# Patient Record
Sex: Female | Born: 1985 | Race: Black or African American | Hispanic: No | Marital: Married | Smoking: Never smoker
Health system: Southern US, Community
[De-identification: ages and names within clinical notes are randomized; demographics above are authoritative.]

## PROBLEM LIST (undated history)

## (undated) ENCOUNTER — Inpatient Hospital Stay (HOSPITAL_COMMUNITY): Payer: Self-pay

## (undated) DIAGNOSIS — R519 Headache, unspecified: Secondary | ICD-10-CM

## (undated) DIAGNOSIS — R51 Headache: Secondary | ICD-10-CM

## (undated) DIAGNOSIS — D219 Benign neoplasm of connective and other soft tissue, unspecified: Secondary | ICD-10-CM

## (undated) DIAGNOSIS — D649 Anemia, unspecified: Secondary | ICD-10-CM

## (undated) DIAGNOSIS — K219 Gastro-esophageal reflux disease without esophagitis: Secondary | ICD-10-CM

## (undated) DIAGNOSIS — O139 Gestational [pregnancy-induced] hypertension without significant proteinuria, unspecified trimester: Secondary | ICD-10-CM

## (undated) HISTORY — DX: Anemia, unspecified: D64.9

## (undated) HISTORY — PX: NO PAST SURGERIES: SHX2092

---

## 2013-01-13 ENCOUNTER — Other Ambulatory Visit (HOSPITAL_COMMUNITY)
Admission: RE | Admit: 2013-01-13 | Discharge: 2013-01-13 | Disposition: A | Discharge status: Home / Self Care | Payer: Self-pay | Source: Ambulatory Visit | Attending: Family Medicine | Admitting: Family Medicine

## 2013-01-13 DIAGNOSIS — Z0000 Encounter for general adult medical examination without abnormal findings: Secondary | ICD-10-CM | POA: Insufficient documentation

## 2013-03-18 ENCOUNTER — Encounter (HOSPITAL_COMMUNITY): Payer: Self-pay

## 2013-03-18 ENCOUNTER — Inpatient Hospital Stay (HOSPITAL_COMMUNITY): Payer: BC Managed Care – PPO

## 2013-03-18 ENCOUNTER — Inpatient Hospital Stay (HOSPITAL_COMMUNITY)
Admission: AD | Admit: 2013-03-18 | Discharge: 2013-03-18 | Disposition: A | Discharge status: Home / Self Care | Payer: BC Managed Care – PPO | Source: Ambulatory Visit | Attending: Obstetrics and Gynecology | Admitting: Obstetrics and Gynecology

## 2013-03-18 ENCOUNTER — Other Ambulatory Visit: Payer: Self-pay | Admitting: Obstetrics and Gynecology

## 2013-03-18 DIAGNOSIS — O021 Missed abortion: Secondary | ICD-10-CM

## 2013-03-18 DIAGNOSIS — R109 Unspecified abdominal pain: Secondary | ICD-10-CM | POA: Insufficient documentation

## 2013-03-18 DIAGNOSIS — O209 Hemorrhage in early pregnancy, unspecified: Secondary | ICD-10-CM | POA: Insufficient documentation

## 2013-03-18 LAB — TYPE AND SCREEN: Antibody Screen: NEGATIVE

## 2013-03-18 LAB — CBC
HCT: 34 % — ABNORMAL LOW (ref 36.0–46.0)
Hemoglobin: 11.7 g/dL — ABNORMAL LOW (ref 12.0–15.0)
MCHC: 34.4 g/dL (ref 30.0–36.0)
WBC: 6.6 10*3/uL (ref 4.0–10.5)

## 2013-03-18 LAB — HCG, QUANTITATIVE, PREGNANCY: hCG, Beta Chain, Quant, S: 303 m[IU]/mL — ABNORMAL HIGH (ref <5)

## 2013-03-18 LAB — ABO/RH: ABO/RH(D): A NEG

## 2013-03-18 NOTE — MAU Note (Signed)
Patient was sent from the office to r/o ectopic pregnancy. Patient states she started spotting yesterday getting heavier this am with some abdominal pain. Patient states she is having minimal bleeding and no pain at this time. Dr. Dion Body states bedside ultrasound did not see pregnancy.

## 2013-03-20 ENCOUNTER — Inpatient Hospital Stay (HOSPITAL_COMMUNITY)
Admission: AD | Admit: 2013-03-20 | Discharge: 2013-03-20 | Disposition: A | Discharge status: Home / Self Care | Payer: BC Managed Care – PPO | Source: Ambulatory Visit | Attending: Obstetrics and Gynecology | Admitting: Obstetrics and Gynecology

## 2013-03-20 DIAGNOSIS — O039 Complete or unspecified spontaneous abortion without complication: Secondary | ICD-10-CM | POA: Insufficient documentation

## 2013-03-20 LAB — HCG, QUANTITATIVE, PREGNANCY: hCG, Beta Chain, Quant, S: 111 m[IU]/mL — ABNORMAL HIGH (ref <5)

## 2013-03-20 NOTE — MAU Note (Signed)
Pt here for f/u  BHCG from Friday.Pt reports she was having pain yesterday and bleeding was heavier. Bleeding is much less today and not having very much pain a t all.

## 2013-03-20 NOTE — MAU Provider Note (Signed)
Ms. Loise Esguerra is a 27 y.o. G1P0 at [redacted]w[redacted]d who presents to MAU today for follow-up quant bhCG. The patient states that bleeding and pain have improved since last visit. Denies N/V or fever.   BP 138/82  Pulse 92  Resp 18  LMP 02/03/2013 GENERAL: Well-developed, well-nourished female in no acute distress.  HEENT: Normocephalic, atraumatic.  LUNGS: Normal effort HEART: Regular rate  SKIN: warm, dry and without erythema PSYCH: normal mood and affect  Results for orders placed during the hospital encounter of 03/20/13 (from the past 24 hour(s))  HCG, QUANTITATIVE, PREGNANCY     Status: Abnormal   Collection Time    03/20/13 12:22 PM      Result Value Range   hCG, Beta Chain, Quant, S 111 (*) <5 mIU/mL    A: SAB  P: Discharge home Bleeding precautions discussed Patient encouraged to keep scheduled follow-up with Dr. Dion Body in the office Patient may return to MAU as needed or if her condition were to change or worsen  Freddi Starr, PA-C 03/20/2013 1:38 PM

## 2013-03-21 NOTE — MAU Provider Note (Signed)
Recommendations d/w PA prior to pt counseling.  Recommended f/u in 2 weeks instead of 1 week if bleeding decreased in light of decreasing quant BHCG.

## 2013-03-21 NOTE — Progress Notes (Signed)
Late entry- Pt's history and physical done in office.  Sent to MAU b/c per my limited transabdominal exam, no signs of an IUP was seen, which was expected for a 6+ week pregnancy.  Quant BHCG reviewed c/w abnormal pregnancy.  Blood type and CBC pending. Ultrasound reviewed with radiologist.  No IUP confirmed, no adnexal masses.  Radiologist reports there was active flow through endocervix, which is highly suggestive of SAB in progress. Ultrasound findings reviewed with pt and husband.  Recommend repeat quant BHCG in 48 hours to confirm SAB.   Pt with an appropriate response, very tearful.  Etiology of SAB and future pregnancies discussed.  All questions of pt and husband answered.  Recommend f/u in 1 week, in the event BHCG is rising.    Addendum- CBC and Type/Screen reviewed.  Pt is Rh neg.  Will call pt to get Rhogam in office on 03/21/2013.

## 2013-06-29 LAB — OB RESULTS CONSOLE RUBELLA ANTIBODY, IGM: Rubella: IMMUNE

## 2013-06-29 LAB — OB RESULTS CONSOLE HEPATITIS B SURFACE ANTIGEN: Hepatitis B Surface Ag: NEGATIVE

## 2013-06-29 LAB — OB RESULTS CONSOLE RPR: RPR: NONREACTIVE

## 2013-06-30 LAB — OB RESULTS CONSOLE HIV ANTIBODY (ROUTINE TESTING): HIV: NONREACTIVE

## 2013-07-06 ENCOUNTER — Encounter (HOSPITAL_COMMUNITY): Payer: Self-pay | Admitting: *Deleted

## 2013-07-06 ENCOUNTER — Inpatient Hospital Stay (HOSPITAL_COMMUNITY)
Admission: AD | Admit: 2013-07-06 | Discharge: 2013-07-06 | Disposition: A | Discharge status: Home / Self Care | Payer: BC Managed Care – PPO | Source: Ambulatory Visit | Attending: Obstetrics and Gynecology | Admitting: Obstetrics and Gynecology

## 2013-07-06 DIAGNOSIS — R51 Headache: Secondary | ICD-10-CM | POA: Insufficient documentation

## 2013-07-06 DIAGNOSIS — O210 Mild hyperemesis gravidarum: Secondary | ICD-10-CM | POA: Insufficient documentation

## 2013-07-06 DIAGNOSIS — O26891 Other specified pregnancy related conditions, first trimester: Secondary | ICD-10-CM

## 2013-07-06 LAB — URINALYSIS, ROUTINE W REFLEX MICROSCOPIC
Bilirubin Urine: NEGATIVE
Ketones, ur: NEGATIVE mg/dL
Leukocytes, UA: NEGATIVE
Nitrite: NEGATIVE
Specific Gravity, Urine: 1.025 (ref 1.005–1.030)
Urobilinogen, UA: 0.2 mg/dL (ref 0.0–1.0)
pH: 6 (ref 5.0–8.0)

## 2013-07-06 MED ORDER — DOXYLAMINE-PYRIDOXINE 10-10 MG PO TBEC: 2.0000 | DELAYED_RELEASE_TABLET | Freq: Every day | ORAL | Status: DC

## 2013-07-06 MED ORDER — ACETAMINOPHEN 500 MG PO TABS
1000.0000 mg | ORAL_TABLET | Freq: Once | ORAL | Status: AC
  Administered 2013-07-06: 1000 mg via ORAL
  Filled 2013-07-06: qty 2

## 2013-07-06 MED ORDER — PROMETHAZINE HCL 25 MG PO TABS
25.0000 mg | ORAL_TABLET | Freq: Once | ORAL | Status: AC
  Administered 2013-07-06: 25 mg via ORAL
  Filled 2013-07-06: qty 1

## 2013-07-06 MED ORDER — OXYCODONE HCL 5 MG PO TABS
5.0000 mg | ORAL_TABLET | Freq: Once | ORAL | Status: AC
  Administered 2013-07-06: 5 mg via ORAL
  Filled 2013-07-06: qty 1

## 2013-07-06 MED ORDER — PROMETHAZINE HCL 25 MG PO TABS: 25.0000 mg | ORAL_TABLET | Freq: Four times a day (QID) | ORAL | Status: DC | PRN

## 2013-07-06 NOTE — MAU Note (Signed)
Pt reports vomiting with pregnancy that has been worsening over the last week. Has meds form MD office but she feels like the meds give her a headache.

## 2013-07-06 NOTE — MAU Provider Note (Signed)
Chief Complaint:  Hyperemesis Gravidarum   First Provider Initiated Contact with Patient 07/06/13 [phone]     HPI: Alice Osborne is a 27 y.o. G2P0 at [redacted]w[redacted]d by LMP who presents to maternity admissions reporting worsening N/V of pregnancy over the past week. Was Rx'd Zofran, but is having generalized HA's that coincide w/ doses of Zofran and no apparent improvement in Sx. Denies fever, chills, neck pain, difficulties w/ speech or gait, abd pain, vaginal bleeding. Very nervous because of SAB 03/2013. Has tried dietary changes for N/V w/ out improvement. Has not tried any other meds.   Past Medical History: History reviewed. No pertinent past medical history.  Past obstetric history: OB History   Grav Para Term Preterm Abortions TAB SAB Ect Mult Living   2         0     # Outc Date GA Lbr Len/2nd Wgt Sex Del Anes PTL Lv   1 GRA            Comments: System Generated. Please review and update pregnancy details.   2 CUR               Past Surgical History: Past Surgical History  Procedure Laterality Date  . No past surgeries      Family History: History reviewed. No pertinent family history.  Social History: History  Substance Use Topics  . Smoking status: Never Smoker   . Smokeless tobacco: Never Used  . Alcohol Use: No    Allergies: No Known Allergies  Meds:  Prescriptions prior to admission  Medication Sig Dispense Refill  . ondansetron (ZOFRAN-ODT) 4 MG disintegrating tablet Take 4 mg by mouth every 8 (eight) hours as needed for nausea.      . Prenatal Vit-Fe Fumarate-FA (PRENATAL MULTIVITAMIN) TABS Take 1 tablet by mouth daily at 12 noon.        ROS: Pertinent findings in history of present illness.  Physical Exam  Blood pressure 129/85, pulse 77, temperature 98 F (36.7 C), temperature source Oral, resp. rate 18, height 5\' 3"  (1.6 m), weight 72.122 kg (159 lb), last menstrual period 04/21/2013, SpO2 100.00%, unknown if currently breastfeeding. GENERAL: Well-developed,  well-nourished female in no acute distress.  HEENT: normocephalic. Mucus membranes moist. HEART: normal rate RESP: normal effort EXTREMITIES: Nontender, no edema NEURO: alert and oriented FHT: 165 by doppler   Labs: Results for orders placed during the hospital encounter of 07/06/13 (from the past 24 hour(s))  URINALYSIS, ROUTINE W REFLEX MICROSCOPIC     Status: None   Collection Time    07/06/13  6:19 AM      Result Value Range   Color, Urine YELLOW  YELLOW   APPearance CLEAR  CLEAR   Specific Gravity, Urine 1.025  1.005 - 1.030   pH 6.0  5.0 - 8.0   Glucose, UA NEGATIVE  NEGATIVE mg/dL   Hgb urine dipstick NEGATIVE  NEGATIVE   Bilirubin Urine NEGATIVE  NEGATIVE   Ketones, ur NEGATIVE  NEGATIVE mg/dL   Protein, ur NEGATIVE  NEGATIVE mg/dL   Urobilinogen, UA 0.2  0.0 - 1.0 mg/dL   Nitrite NEGATIVE  NEGATIVE   Leukocytes, UA NEGATIVE  NEGATIVE    Imaging:  No results found.  MAU Course: Pt thinks she can keep down a tablet. Will give phenergan. If tolerating PO's, will PO hydrate and give HA meds PRN. Pt got relief of nausea with phenergan- pt able to tolerated PO fluids Tylenol 1000mg  given for headache which brought pain down  from 10/10 to 8/10- pain is right side of head Care of pt turned over to Pamelia Hoit, NP at 0800. Since pt's headache not relieved- Oxycodone 5mg  ordered- pt's headache pain reduced to 2/10 Pt ready to go home Discussed with Dr. Dion Body- will send pt home with Diclegis and phenergan and make sure pt is able to PO hydrate  Assessment/Plan: Hyperemesis in early pregnancy- phenergan 25mg  tablets; Diclegis  Diet for hyperemesis Headache in pregnancy- push PO fluids Keep appointment Fri with Dr. Dion Body as scheduled Dorathy Kinsman, CNM 07/06/2013 8:16 AM Pamelia Hoit WHNP-BC

## 2013-07-06 NOTE — MAU Note (Signed)
Pt comfortable, meds given

## 2013-07-08 LAB — OB RESULTS CONSOLE GC/CHLAMYDIA
Chlamydia: NEGATIVE
Gonorrhea: NEGATIVE

## 2013-11-03 ENCOUNTER — Ambulatory Visit
Admission: RE | Admit: 2013-11-03 | Discharge: 2013-11-03 | Disposition: A | Discharge status: Home / Self Care | Payer: BC Managed Care – PPO | Source: Ambulatory Visit | Attending: Obstetrics and Gynecology | Admitting: Obstetrics and Gynecology

## 2013-11-03 ENCOUNTER — Other Ambulatory Visit: Payer: Self-pay | Admitting: Obstetrics and Gynecology

## 2013-11-03 DIAGNOSIS — M79604 Pain in right leg: Secondary | ICD-10-CM

## 2013-11-03 IMAGING — US US EXTREM LOW VENOUS*R*
1 series · 14 of 24 positions shown · non-contrast
Comparison: None.

CLINICAL DATA: Right lower extremity pain and edema

EXAM:
RIGHT LOWER EXTREMITY VENOUS DUPLEX ULTRASOUND
TECHNIQUE: Gray-scale sonography with graded compression, as well as color
Doppler and duplex ultrasound, were performed to evaluate the deep
venous system from the level of the common femoral vein through the
popliteal and proximal calf veins. Spectral Doppler was utilized to
evaluate flow at rest and with distal augmentation maneuvers.

[Series 1: us extrem low venous*right* · 14 of 31 slices shown]
[im 1/31]
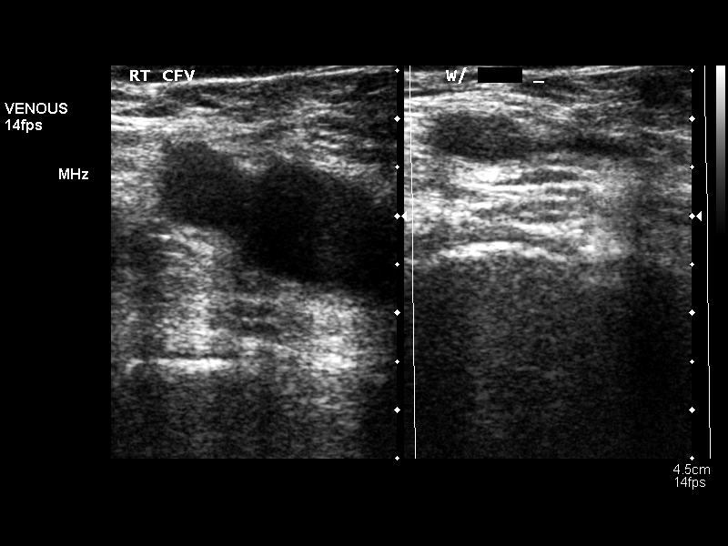
[im 3/31]
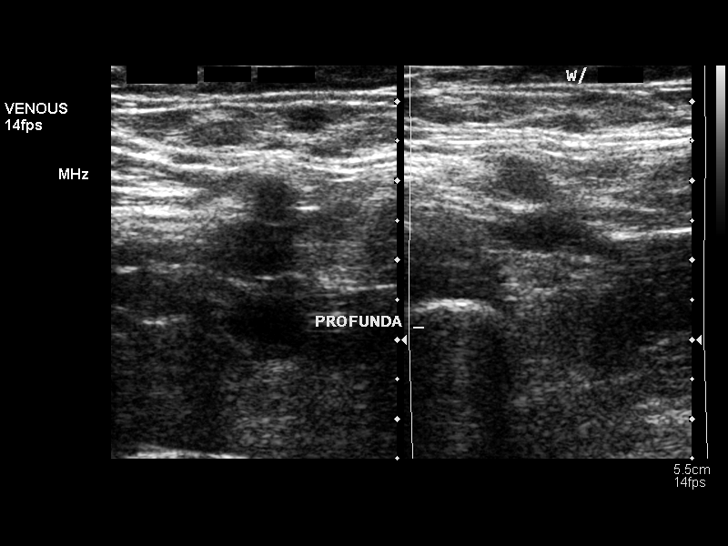
[im 6/31]
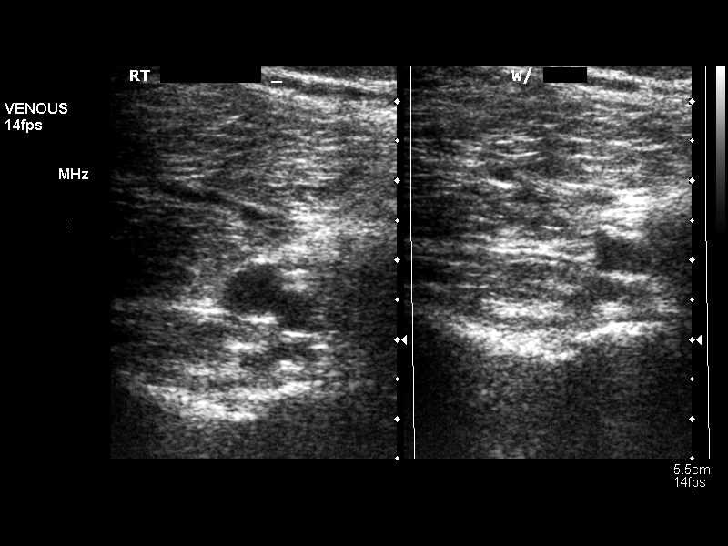
[im 8/31]
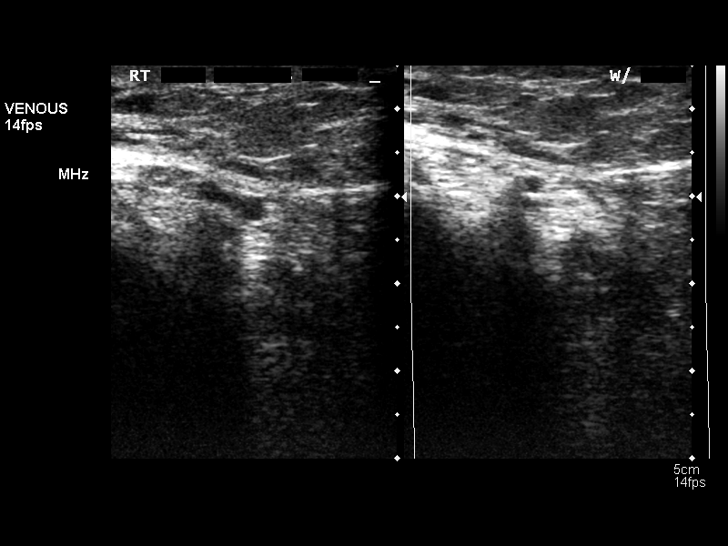
[im 10/31]
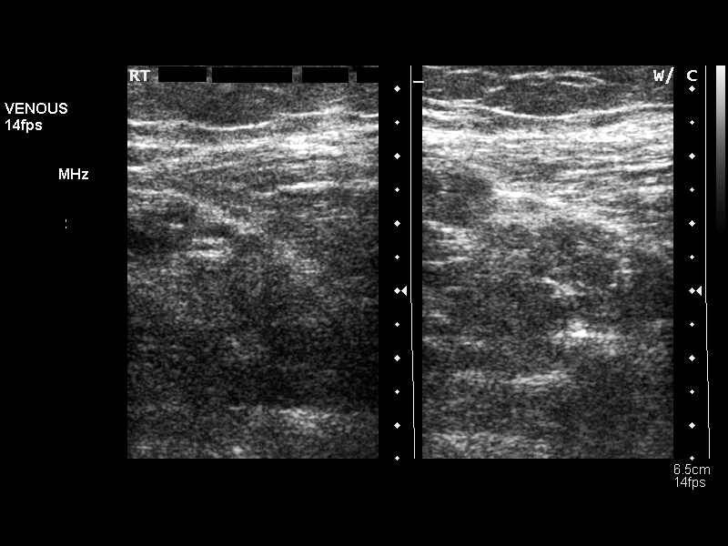
[im 12/31]
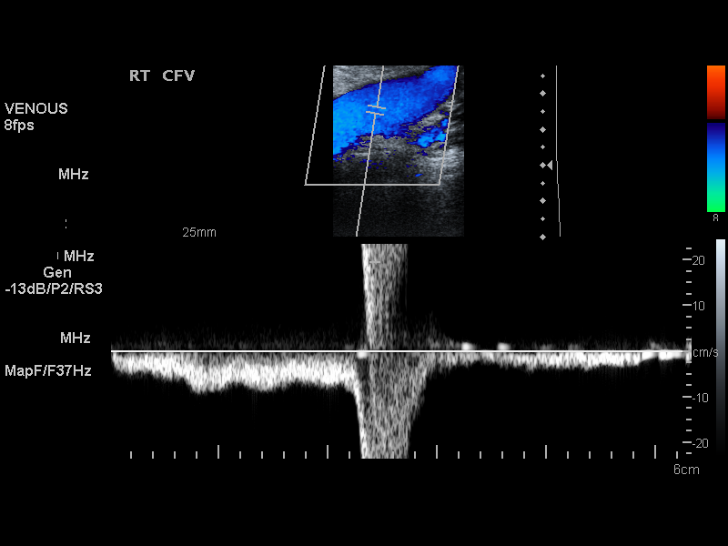
[im 15/31]
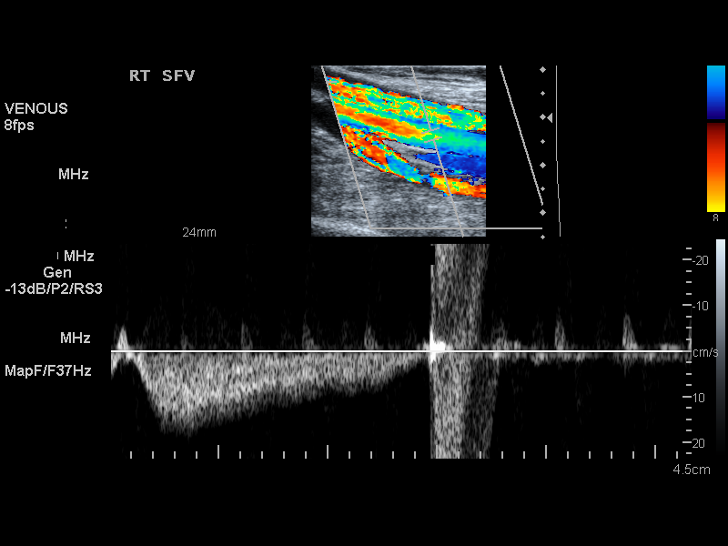
[im 16/31]
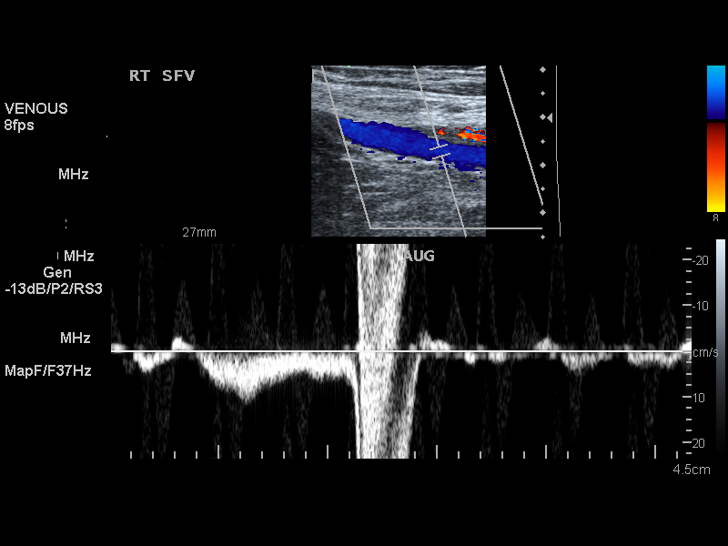
[im 19/31]
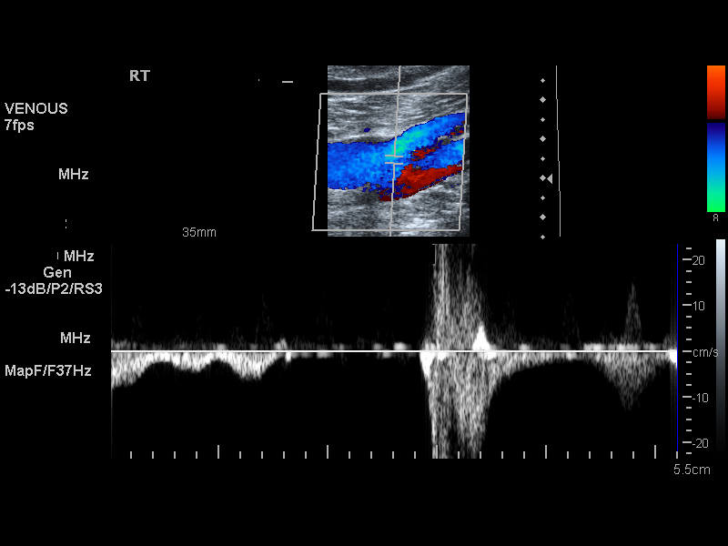
[im 21/31]
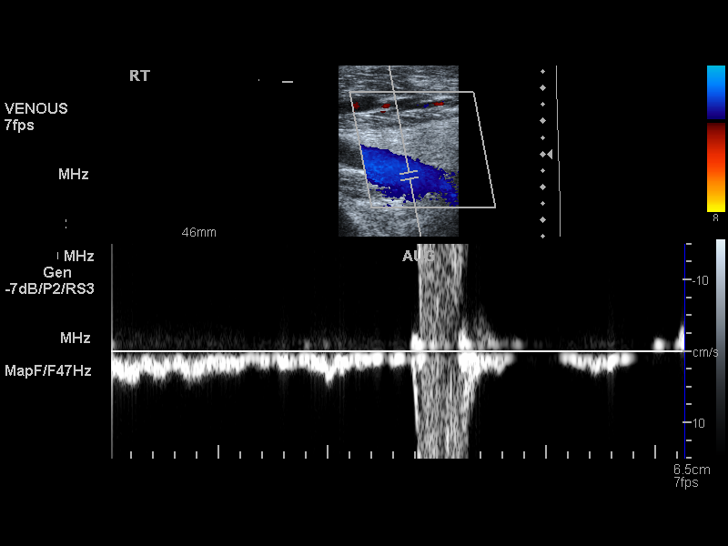
[im 24/31]
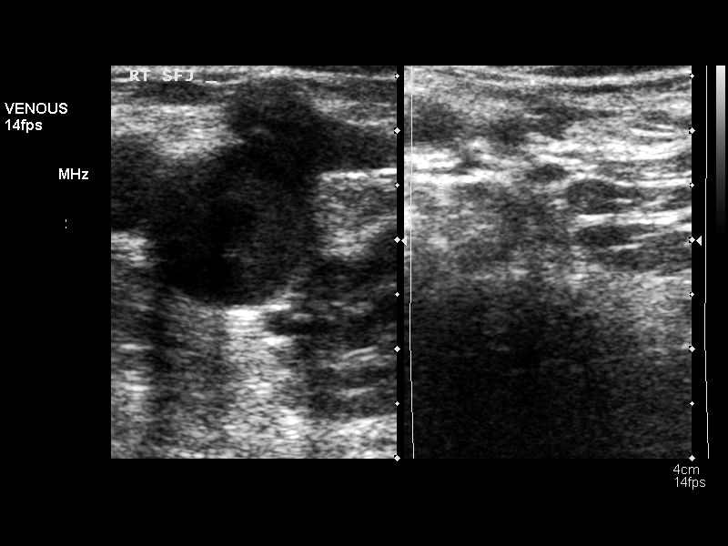
[im 25/31]
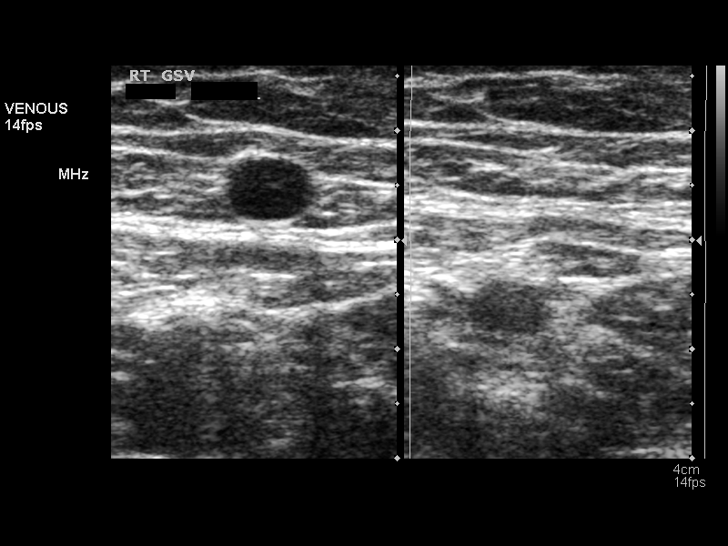
[im 28/31]
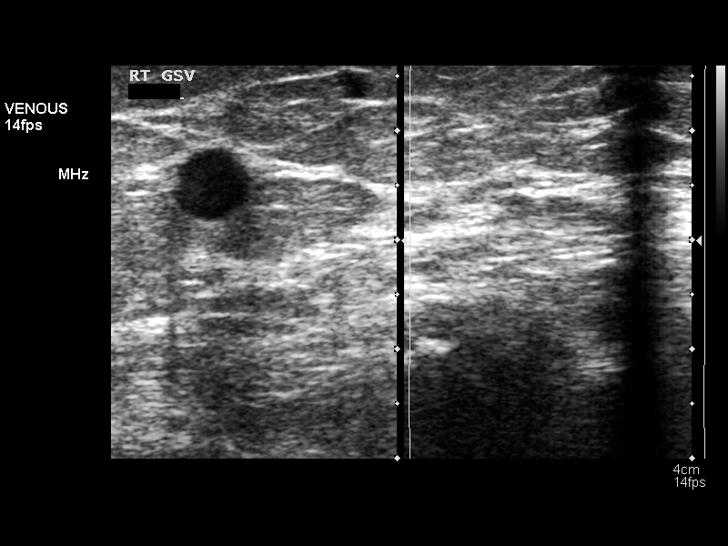
[im 31/31]
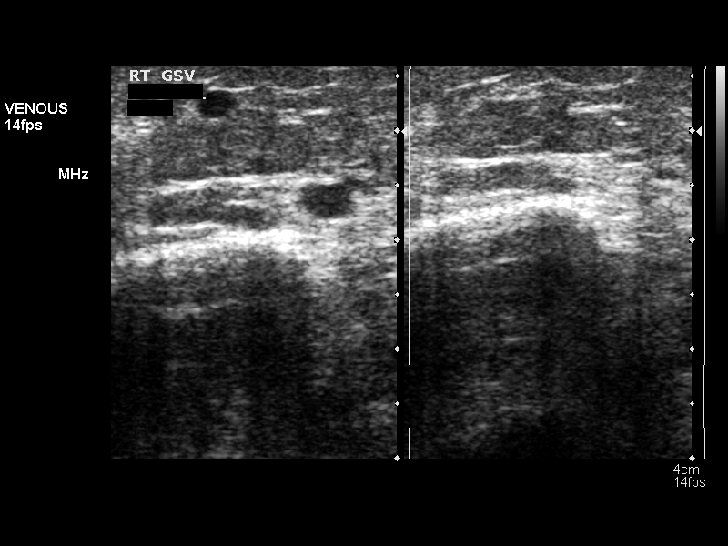

[14 of 24 positions shown; findings below may reference images not displayed]

FINDINGS: Flow in the venous structures of the right lower extremity is
spontaneous and phasic in all segments. There is normal compression
and augmentation in the venous structures of the right lower
extremity. Venous Doppler signal is normal in all regions. There is
no thrombus in the deep or visualized superficial venous structures
on the right. There is no right lower extremity deep venous
incompetence.
IMPRESSION: No evidence of right lower extremity deep venous thrombosis.

## 2013-12-15 NOTE — L&D Delivery Note (Signed)
Delivery Note At 12:18 AM a viable female was delivered via Vaginal, Spontaneous Delivery (Presentation: LOA).  Loose nuchal cord easily reduced over head prior to delivery of body.  APGAR: 7, 8; weight pending.   Placenta status: Intact, Spontaneous.  Cord: 3 vessels with the following complications: None.  Cord pH: 7.166 and 7.276.   Dr. Kennon Rounds called to bedside d/t low FHT.  NICU called to delivery for 35 weeks and mag sulfate.  Anesthesia: Epidural  Episiotomy: None Lacerations: 1st degree vaginal extending to left labial Suture Repair: 3.0 vicryl Est. Blood Loss (mL): 450 ml  Mom to AICU.  Baby to Couplet care / Skin to Skin.  Routine PP orders Mag sulfate x 24 hours PP Continue Labetalol  Breastfeeding Placenta to pathology  Myron Stankovich 12/27/2013, 1:13 AM

## 2013-12-15 NOTE — L&D Delivery Note (Signed)
I was not the doctor on at time of delivery.

## 2013-12-23 ENCOUNTER — Inpatient Hospital Stay (HOSPITAL_COMMUNITY): Payer: BC Managed Care – PPO

## 2013-12-23 ENCOUNTER — Inpatient Hospital Stay (HOSPITAL_COMMUNITY)
Admission: AD | Admit: 2013-12-23 | Discharge: 2013-12-29 | DRG: 774 | Disposition: A | Discharge status: Home / Self Care | Payer: BC Managed Care – PPO | Source: Ambulatory Visit | Attending: Obstetrics and Gynecology | Admitting: Obstetrics and Gynecology

## 2013-12-23 ENCOUNTER — Other Ambulatory Visit: Payer: Self-pay | Admitting: Obstetrics and Gynecology

## 2013-12-23 ENCOUNTER — Encounter (HOSPITAL_COMMUNITY): Payer: Self-pay | Admitting: *Deleted

## 2013-12-23 DIAGNOSIS — O2200 Varicose veins of lower extremity in pregnancy, unspecified trimester: Secondary | ICD-10-CM | POA: Diagnosis present

## 2013-12-23 DIAGNOSIS — O139 Gestational [pregnancy-induced] hypertension without significant proteinuria, unspecified trimester: Secondary | ICD-10-CM | POA: Diagnosis present

## 2013-12-23 DIAGNOSIS — O9989 Other specified diseases and conditions complicating pregnancy, childbirth and the puerperium: Secondary | ICD-10-CM

## 2013-12-23 DIAGNOSIS — M79609 Pain in unspecified limb: Secondary | ICD-10-CM

## 2013-12-23 DIAGNOSIS — O99892 Other specified diseases and conditions complicating childbirth: Secondary | ICD-10-CM | POA: Diagnosis present

## 2013-12-23 DIAGNOSIS — O1414 Severe pre-eclampsia complicating childbirth: Principal | ICD-10-CM | POA: Diagnosis present

## 2013-12-23 DIAGNOSIS — O1410 Severe pre-eclampsia, unspecified trimester: Secondary | ICD-10-CM | POA: Diagnosis present

## 2013-12-23 DIAGNOSIS — O360990 Maternal care for other rhesus isoimmunization, unspecified trimester, not applicable or unspecified: Secondary | ICD-10-CM | POA: Diagnosis present

## 2013-12-23 DIAGNOSIS — Z22330 Carrier of Group B streptococcus: Secondary | ICD-10-CM

## 2013-12-23 DIAGNOSIS — M7989 Other specified soft tissue disorders: Secondary | ICD-10-CM

## 2013-12-23 HISTORY — DX: Benign neoplasm of connective and other soft tissue, unspecified: D21.9

## 2013-12-23 HISTORY — DX: Gastro-esophageal reflux disease without esophagitis: K21.9

## 2013-12-23 HISTORY — DX: Gestational (pregnancy-induced) hypertension without significant proteinuria, unspecified trimester: O13.9

## 2013-12-23 LAB — CBC
HCT: 32.8 % — ABNORMAL LOW (ref 36.0–46.0)
Hemoglobin: 11.9 g/dL — ABNORMAL LOW (ref 12.0–15.0)
MCH: 31.7 pg (ref 26.0–34.0)
MCHC: 36.3 g/dL — ABNORMAL HIGH (ref 30.0–36.0)
MCV: 87.5 fL (ref 78.0–100.0)
Platelets: 155 10*3/uL (ref 150–400)
RBC: 3.75 MIL/uL — ABNORMAL LOW (ref 3.87–5.11)
RDW: 13.2 % (ref 11.5–15.5)
WBC: 7 10*3/uL (ref 4.0–10.5)

## 2013-12-23 LAB — COMPREHENSIVE METABOLIC PANEL
ALT: 24 U/L (ref 0–35)
AST: 35 U/L (ref 0–37)
Albumin: 2.6 g/dL — ABNORMAL LOW (ref 3.5–5.2)
Alkaline Phosphatase: 120 U/L — ABNORMAL HIGH (ref 39–117)
BUN: 23 mg/dL (ref 6–23)
CALCIUM: 9.2 mg/dL (ref 8.4–10.5)
CO2: 18 meq/L — AB (ref 19–32)
CREATININE: 0.69 mg/dL (ref 0.50–1.10)
Chloride: 100 mEq/L (ref 96–112)
Glucose, Bld: 75 mg/dL (ref 70–99)
Potassium: 4.6 mEq/L (ref 3.7–5.3)
Sodium: 133 mEq/L — ABNORMAL LOW (ref 137–147)
Total Bilirubin: <0.2 mg/dL — ABNORMAL LOW (ref 0.3–1.2)
Total Protein: 6.6 g/dL (ref 6.0–8.3)

## 2013-12-23 LAB — URINALYSIS, DIPSTICK ONLY
Bilirubin Urine: NEGATIVE
Glucose, UA: NEGATIVE mg/dL
KETONES UR: NEGATIVE mg/dL
Leukocytes, UA: NEGATIVE
NITRITE: NEGATIVE
PH: 6.5 (ref 5.0–8.0)
Protein, ur: >300 mg/dL — AB
Specific Gravity, Urine: 1.02 (ref 1.005–1.030)
Urobilinogen, UA: 0.2 mg/dL (ref 0.0–1.0)

## 2013-12-23 LAB — PROTEIN / CREATININE RATIO, URINE
CREATININE, URINE: 56.42 mg/dL
PROTEIN CREATININE RATIO: 9.5 — AB (ref 0.00–0.15)
TOTAL PROTEIN, URINE: 536.1 mg/dL

## 2013-12-23 LAB — GROUP B STREP BY PCR
GROUP B STREP BY PCR: INVALID — AB
Group B strep by PCR: POSITIVE — AB

## 2013-12-23 LAB — LACTATE DEHYDROGENASE: LDH: 255 U/L — ABNORMAL HIGH (ref 94–250)

## 2013-12-23 LAB — OB RESULTS CONSOLE GBS: GBS: POSITIVE

## 2013-12-23 LAB — URIC ACID: URIC ACID, SERUM: 6.6 mg/dL (ref 2.4–7.0)

## 2013-12-23 LAB — RPR: RPR Ser Ql: NONREACTIVE

## 2013-12-23 MED ORDER — ZOLPIDEM TARTRATE 5 MG PO TABS
5.0000 mg | ORAL_TABLET | Freq: Every evening | ORAL | Status: DC | PRN
  Administered 2013-12-23 – 2013-12-25 (×3): 5 mg via ORAL
  Filled 2013-12-23 (×3): qty 1

## 2013-12-23 MED ORDER — ACETAMINOPHEN 325 MG PO TABS: 650.0000 mg | ORAL_TABLET | ORAL | Status: DC | PRN

## 2013-12-23 MED ORDER — SODIUM CHLORIDE 0.9 % IJ SOLN
3.0000 mL | INTRAMUSCULAR | Status: DC | PRN
  Administered 2013-12-24 (×2): 3 mL via INTRAVENOUS

## 2013-12-23 MED ORDER — CITRIC ACID-SODIUM CITRATE 334-500 MG/5ML PO SOLN: 30.0000 mL | ORAL | Status: DC | PRN

## 2013-12-23 MED ORDER — ONDANSETRON HCL 4 MG/2ML IJ SOLN: 4.0000 mg | Freq: Four times a day (QID) | INTRAMUSCULAR | Status: DC | PRN

## 2013-12-23 MED ORDER — OXYTOCIN 40 UNITS IN LACTATED RINGERS INFUSION - SIMPLE MED: 62.5000 mL/h | INTRAVENOUS | Status: DC

## 2013-12-23 MED ORDER — FERROUS SULFATE 325 (65 FE) MG PO TABS
325.0000 mg | ORAL_TABLET | Freq: Two times a day (BID) | ORAL | Status: DC
  Administered 2013-12-24 (×2): 325 mg via ORAL
  Filled 2013-12-23 (×3): qty 1

## 2013-12-23 MED ORDER — IBUPROFEN 600 MG PO TABS: 600.0000 mg | ORAL_TABLET | Freq: Four times a day (QID) | ORAL | Status: DC | PRN

## 2013-12-23 MED ORDER — PRENATAL MULTIVITAMIN CH
1.0000 | ORAL_TABLET | Freq: Every day | ORAL | Status: DC
  Administered 2013-12-24: 1 via ORAL
  Filled 2013-12-23: qty 1

## 2013-12-23 MED ORDER — OXYTOCIN BOLUS FROM INFUSION: 500.0000 mL | INTRAVENOUS | Status: DC

## 2013-12-23 MED ORDER — DOCUSATE SODIUM 100 MG PO CAPS
100.0000 mg | ORAL_CAPSULE | Freq: Every day | ORAL | Status: DC
  Administered 2013-12-24: 100 mg via ORAL
  Filled 2013-12-23: qty 1

## 2013-12-23 MED ORDER — PROMETHAZINE HCL 25 MG PO TABS: 25.0000 mg | ORAL_TABLET | Freq: Four times a day (QID) | ORAL | Status: DC | PRN

## 2013-12-23 MED ORDER — LIDOCAINE HCL (PF) 1 % IJ SOLN: 30.0000 mL | INTRAMUSCULAR | Status: DC | PRN

## 2013-12-23 MED ORDER — ACETAMINOPHEN 325 MG PO TABS
650.0000 mg | ORAL_TABLET | Freq: Four times a day (QID) | ORAL | Status: DC | PRN
  Administered 2013-12-23 – 2013-12-24 (×3): 650 mg via ORAL
  Filled 2013-12-23 (×3): qty 2

## 2013-12-23 MED ORDER — SODIUM CHLORIDE 0.9 % IJ SOLN
3.0000 mL | Freq: Two times a day (BID) | INTRAMUSCULAR | Status: DC
  Administered 2013-12-23 – 2013-12-24 (×2): 3 mL via INTRAVENOUS

## 2013-12-23 MED ORDER — CALCIUM CARBONATE ANTACID 500 MG PO CHEW: 2.0000 | CHEWABLE_TABLET | ORAL | Status: DC | PRN

## 2013-12-23 MED ORDER — LACTATED RINGERS IV SOLN: 500.0000 mL | INTRAVENOUS | Status: DC | PRN

## 2013-12-23 MED ORDER — LACTATED RINGERS IV SOLN
INTRAVENOUS | Status: DC
Start: 2013-12-23 — End: 2013-12-23
  Administered 2013-12-23: 12:00:00 via INTRAVENOUS

## 2013-12-23 MED ORDER — SODIUM CHLORIDE 0.9 % IV SOLN: 250.0000 mL | INTRAVENOUS | Status: DC | PRN

## 2013-12-23 MED ORDER — OXYCODONE-ACETAMINOPHEN 5-325 MG PO TABS: 1.0000 | ORAL_TABLET | ORAL | Status: DC | PRN

## 2013-12-23 MED ORDER — LABETALOL HCL 5 MG/ML IV SOLN
10.0000 mg | INTRAVENOUS | Status: DC | PRN
  Administered 2013-12-24 (×3): 10 mg via INTRAVENOUS
  Filled 2013-12-23 (×4): qty 4

## 2013-12-23 NOTE — Progress Notes (Signed)
*  PRELIMINARY RESULTS* Vascular Ultrasound Right lower extremity venous duplex has been completed.  Preliminary findings: No evidence of DVT or baker's cyst. Varicose veins are noted at the groin area and calf area.  Consistent with study from 11/03/13.  Landry Mellow, RDMS, RVT  12/23/2013, 2:16 PM

## 2013-12-24 ENCOUNTER — Encounter (HOSPITAL_COMMUNITY): Payer: Self-pay | Admitting: *Deleted

## 2013-12-24 DIAGNOSIS — O139 Gestational [pregnancy-induced] hypertension without significant proteinuria, unspecified trimester: Secondary | ICD-10-CM | POA: Diagnosis present

## 2013-12-24 DIAGNOSIS — M7989 Other specified soft tissue disorders: Secondary | ICD-10-CM

## 2013-12-24 LAB — COMPREHENSIVE METABOLIC PANEL WITH GFR
ALT: 20 U/L (ref 0–35)
AST: 29 U/L (ref 0–37)
Albumin: 1.9 g/dL — ABNORMAL LOW (ref 3.5–5.2)
Alkaline Phosphatase: 96 U/L (ref 39–117)
BUN: 22 mg/dL (ref 6–23)
CO2: 20 meq/L (ref 19–32)
Calcium: 9.1 mg/dL (ref 8.4–10.5)
Chloride: 105 meq/L (ref 96–112)
Creatinine, Ser: 0.58 mg/dL (ref 0.50–1.10)
GFR calc Af Amer: >90 mL/min
GFR calc non Af Amer: >90 mL/min
Glucose, Bld: 81 mg/dL (ref 70–99)
Potassium: 4.4 meq/L (ref 3.7–5.3)
Sodium: 134 meq/L — ABNORMAL LOW (ref 137–147)
Total Bilirubin: <0.2 mg/dL — ABNORMAL LOW (ref 0.3–1.2)
Total Protein: 5 g/dL — ABNORMAL LOW (ref 6.0–8.3)

## 2013-12-24 LAB — PROTEIN, URINE, 24 HOUR
Collection Interval-UPROT: 24 h
Protein, 24H Urine: 12210 mg/d — ABNORMAL HIGH (ref 50–100)
Protein, Urine: 888 mg/dL
Urine Total Volume-UPROT: 1375 mL

## 2013-12-24 LAB — CBC
HCT: 29.3 % — ABNORMAL LOW (ref 36.0–46.0)
Hemoglobin: 10.5 g/dL — ABNORMAL LOW (ref 12.0–15.0)
MCH: 31.4 pg (ref 26.0–34.0)
MCHC: 35.8 g/dL (ref 30.0–36.0)
MCV: 87.7 fL (ref 78.0–100.0)
Platelets: 132 10*3/uL — ABNORMAL LOW (ref 150–400)
RBC: 3.34 MIL/uL — ABNORMAL LOW (ref 3.87–5.11)
RDW: 13.1 % (ref 11.5–15.5)
WBC: 6.1 10*3/uL (ref 4.0–10.5)

## 2013-12-24 LAB — CREATININE CLEARANCE, URINE, 24 HOUR
COLLECTION INTERVAL-CRCL: 24 h
Creatinine Clearance: 143 mL/min — ABNORMAL HIGH (ref 75–115)
Creatinine, 24H Ur: 1194 mg/d (ref 700–1800)
Creatinine, Urine: 86.82 mg/dL
Creatinine: 0.58 mg/dL (ref 0.50–1.10)
Urine Total Volume-CRCL: 1375 mL

## 2013-12-24 LAB — URIC ACID: URIC ACID, SERUM: 7 mg/dL (ref 2.4–7.0)

## 2013-12-24 LAB — LACTATE DEHYDROGENASE: LDH: 215 U/L (ref 94–250)

## 2013-12-24 MED ORDER — CITRIC ACID-SODIUM CITRATE 334-500 MG/5ML PO SOLN: 30.0000 mL | ORAL | Status: DC | PRN

## 2013-12-24 MED ORDER — OXYTOCIN BOLUS FROM INFUSION
500.0000 mL | INTRAVENOUS | Status: DC
  Administered 2013-12-27 (×2): 500 mL via INTRAVENOUS

## 2013-12-24 MED ORDER — OXYCODONE-ACETAMINOPHEN 5-325 MG PO TABS: 1.0000 | ORAL_TABLET | ORAL | Status: DC | PRN

## 2013-12-24 MED ORDER — LABETALOL HCL 200 MG PO TABS
200.0000 mg | ORAL_TABLET | Freq: Three times a day (TID) | ORAL | Status: DC
  Administered 2013-12-24 – 2013-12-27 (×9): 200 mg via ORAL
  Filled 2013-12-24 (×12): qty 1

## 2013-12-24 MED ORDER — ONDANSETRON HCL 4 MG/2ML IJ SOLN: 4.0000 mg | Freq: Four times a day (QID) | INTRAMUSCULAR | Status: DC | PRN

## 2013-12-24 MED ORDER — TERBUTALINE SULFATE 1 MG/ML IJ SOLN: 0.2500 mg | Freq: Once | INTRAMUSCULAR | Status: AC | PRN

## 2013-12-24 MED ORDER — ACETAMINOPHEN 325 MG PO TABS
650.0000 mg | ORAL_TABLET | ORAL | Status: DC | PRN
  Administered 2013-12-25 – 2013-12-26 (×2): 650 mg via ORAL
  Filled 2013-12-24 (×2): qty 2

## 2013-12-24 MED ORDER — IBUPROFEN 600 MG PO TABS: 600.0000 mg | ORAL_TABLET | Freq: Four times a day (QID) | ORAL | Status: DC | PRN

## 2013-12-24 MED ORDER — FLEET ENEMA 7-19 GM/118ML RE ENEM: 1.0000 | ENEMA | RECTAL | Status: DC | PRN

## 2013-12-24 MED ORDER — OXYTOCIN 40 UNITS IN LACTATED RINGERS INFUSION - SIMPLE MED
62.5000 mL/h | INTRAVENOUS | Status: DC
  Filled 2013-12-24 (×3): qty 1000

## 2013-12-24 MED ORDER — LACTATED RINGERS IV SOLN
INTRAVENOUS | Status: DC
  Administered 2013-12-25: 23:00:00 via INTRAVENOUS

## 2013-12-24 MED ORDER — LACTATED RINGERS IV SOLN: 500.0000 mL | INTRAVENOUS | Status: DC | PRN

## 2013-12-24 MED ORDER — LIDOCAINE HCL (PF) 1 % IJ SOLN
30.0000 mL | INTRAMUSCULAR | Status: AC | PRN
  Administered 2013-12-27: 30 mL via SUBCUTANEOUS
  Filled 2013-12-24 (×2): qty 30

## 2013-12-24 MED ORDER — MISOPROSTOL 25 MCG QUARTER TABLET
25.0000 ug | ORAL_TABLET | ORAL | Status: DC | PRN
  Administered 2013-12-24: 25 ug via VAGINAL
  Filled 2013-12-24 (×2): qty 0.25
  Filled 2013-12-24: qty 1
  Filled 2013-12-24: qty 0.25

## 2013-12-24 NOTE — Progress Notes (Signed)
Patient ID: Alice Osborne, female   DOB: September 12, 1986, 28 y.o.   MRN: [id number] Pt without complaints.  No leakage of fluid or VB.  Good FM Pt with occ headache and blurred vision  BP 152/92  Pulse 78  Temp(Src) 98.5 F (36.9 C) (Oral)  Resp 18  Ht 5' 3"  (1.6 m)  Wt 213 lb 14.4 oz (97.024 kg)  BMI 37.90 kg/m2  LMP 04/21/2013  FHTS Category 1  Toco irregular  Pt in NAD CV RRR Lungs CTAB abd  Gravid soft and NT GU no vb EXt Bilateral swelling.  Right greater than left Results for orders placed during the hospital encounter of 12/23/13 (from the past 72 hour(s))  GROUP B STREP BY PCR     Status: Abnormal   Collection Time    12/23/13 12:27 PM      Result Value Range   Group B strep by PCR INVALID RESULTS, SPECIMEN SENT FOR CULTURE (*) NEGATIVE   Comment: RESULT CALLED TO, READ BACK BY AND VERIFIED WITH:     T.CRESENZO AT 1350 ON 1.9.15 BY JPEEBLES     RN WILL RECOLLECT, NO CULTURE SENT  CBC     Status: Abnormal   Collection Time    12/23/13 12:30 PM      Result Value Range   WBC 7.0  4.0 - 10.5 K/uL   RBC 3.75 (*) 3.87 - 5.11 MIL/uL   Hemoglobin 11.9 (*) 12.0 - 15.0 g/dL   HCT 32.8 (*) 36.0 - 46.0 %   MCV 87.5  78.0 - 100.0 fL   MCH 31.7  26.0 - 34.0 pg   MCHC 36.3 (*) 30.0 - 36.0 g/dL   RDW 13.2  11.5 - 15.5 %   Platelets 155  150 - 400 K/uL  COMPREHENSIVE METABOLIC PANEL     Status: Abnormal   Collection Time    12/23/13 12:30 PM      Result Value Range   Sodium 133 (*) 137 - 147 mEq/L   Potassium 4.6  3.7 - 5.3 mEq/L   Chloride 100  96 - 112 mEq/L   CO2 18 (*) 19 - 32 mEq/L   Glucose, Bld 75  70 - 99 mg/dL   BUN 23  6 - 23 mg/dL   Creatinine, Ser 0.69  0.50 - 1.10 mg/dL   Calcium 9.2  8.4 - 10.5 mg/dL   Total Protein 6.6  6.0 - 8.3 g/dL   Albumin 2.6 (*) 3.5 - 5.2 g/dL   AST 35  0 - 37 U/L   ALT 24  0 - 35 U/L   Alkaline Phosphatase 120 (*) 39 - 117 U/L   Total Bilirubin <0.2 (*) 0.3 - 1.2 mg/dL   Comment: REPEATED TO VERIFY   GFR calc non Af Amer >90  >90  mL/min   GFR calc Af Amer >90  >90 mL/min   Comment: (NOTE)     The eGFR has been calculated using the CKD EPI equation.     This calculation has not been validated in all clinical situations.     eGFR's persistently <90 mL/min signify possible Chronic Kidney     Disease.  LACTATE DEHYDROGENASE     Status: Abnormal   Collection Time    12/23/13 12:30 PM      Result Value Range   LDH 255 (*) 94 - 250 U/L  RPR     Status: None   Collection Time    12/23/13 12:30 PM      Result  Value Range   RPR NON REACTIVE  NON REACTIVE   Comment: Performed at [facility] ACID     Status: None   Collection Time    12/23/13 12:30 PM      Result Value Range   Uric Acid, Serum 6.6  2.4 - 7.0 mg/dL  TYPE AND SCREEN     Status: None   Collection Time    12/23/13 12:30 PM      Result Value Range   ABO/RH(D) A NEG     Antibody Screen POS     Sample Expiration 12/26/2013     Antibody Identification PASSIVELY ACQUIRED ANTI-D     DAT, IgG NEG     Unit Number [id number]     Blood Component Type RED CELLS,LR     Unit division 00     Status of Unit ALLOCATED     Transfusion Status OK TO TRANSFUSE     Crossmatch Result COMPATIBLE     Unit Number [id number]     Blood Component Type RED CELLS,LR     Unit division 00     Status of Unit ALLOCATED     Transfusion Status OK TO TRANSFUSE     Crossmatch Result COMPATIBLE    PROTEIN / CREATININE RATIO, URINE     Status: Abnormal   Collection Time    12/23/13  1:30 PM      Result Value Range   Creatinine, Urine 56.42     Total Protein, Urine 536.1     Comment: NO NORMAL RANGE ESTABLISHED FOR THIS TEST   PROTEIN CREATININE RATIO 9.50 (*) 0.00 - 0.15   Comment: Performed at [facility], DIPSTICK ONLY     Status: Abnormal   Collection Time    12/23/13  1:30 PM      Result Value Range   Specific Gravity, Urine 1.020  1.005 - 1.030   pH 6.5  5.0 - 8.0   Glucose, UA NEGATIVE  NEGATIVE mg/dL   Hgb urine dipstick  SMALL (*) NEGATIVE   Bilirubin Urine NEGATIVE  NEGATIVE   Ketones, ur NEGATIVE  NEGATIVE mg/dL   Protein, ur >300 (*) NEGATIVE mg/dL   Urobilinogen, UA 0.2  0.0 - 1.0 mg/dL   Nitrite NEGATIVE  NEGATIVE   Leukocytes, UA NEGATIVE  NEGATIVE  GROUP B STREP BY PCR     Status: Abnormal   Collection Time    12/23/13  1:56 PM      Result Value Range   Group B strep by PCR POSITIVE (*) NEGATIVE   Comment: RESULT CALLED TO, READ BACK BY AND VERIFIED WITH:     T.CRESCENZO AT 1521 ON 1.9.15 BY JPEEBLES  OB RESULTS CONSOLE GBS     Status: None   Collection Time    12/23/13  3:34 PM      Result Value Range   GBS Positive    URIC ACID     Status: None   Collection Time    12/24/13  5:05 AM      Result Value Range   Uric Acid, Serum 7.0  2.4 - 7.0 mg/dL  LACTATE DEHYDROGENASE     Status: None   Collection Time    12/24/13  5:05 AM      Result Value Range   LDH 215  94 - 250 U/L  COMPREHENSIVE METABOLIC PANEL     Status: Abnormal   Collection Time    12/24/13  5:05 AM  Result Value Range   Sodium 134 (*) 137 - 147 mEq/L   Potassium 4.4  3.7 - 5.3 mEq/L   Chloride 105  96 - 112 mEq/L   CO2 20  19 - 32 mEq/L   Glucose, Bld 81  70 - 99 mg/dL   BUN 22  6 - 23 mg/dL   Creatinine, Ser 0.58  0.50 - 1.10 mg/dL   Calcium 9.1  8.4 - 10.5 mg/dL   Total Protein 5.0 (*) 6.0 - 8.3 g/dL   Albumin 1.9 (*) 3.5 - 5.2 g/dL   AST 29  0 - 37 U/L   ALT 20  0 - 35 U/L   Alkaline Phosphatase 96  39 - 117 U/L   Total Bilirubin <0.2 (*) 0.3 - 1.2 mg/dL   Comment: REPEATED TO VERIFY   GFR calc non Af Amer >90  >90 mL/min   GFR calc Af Amer >90  >90 mL/min   Comment: (NOTE)     The eGFR has been calculated using the CKD EPI equation.     This calculation has not been validated in all clinical situations.     eGFR's persistently <90 mL/min signify possible Chronic Kidney     Disease.  CBC     Status: Abnormal   Collection Time    12/24/13  5:05 AM      Result Value Range   WBC 6.1  4.0 - 10.5 K/uL    RBC 3.34 (*) 3.87 - 5.11 MIL/uL   Hemoglobin 10.5 (*) 12.0 - 15.0 g/dL   HCT 29.3 (*) 36.0 - 46.0 %   MCV 87.7  78.0 - 100.0 fL   MCH 31.4  26.0 - 34.0 pg   MCHC 35.8  30.0 - 36.0 g/dL   RDW 13.1  11.5 - 15.5 %   Platelets 132 (*) 150 - 400 K/uL    Assessment and Plan [redacted]w[redacted]d Preeclampsia requiring IV labetalol and with headache Will proceed with induction Start magnesium in active labor or if unable to control BPs GBS still pending. If not back would start PCN G in labor Discussed this in detail with the pt.  She agrees with the plan. She understands induction may take more than one day

## 2013-12-24 NOTE — Progress Notes (Signed)
Spoke with on-call Vascular Surgeon, Dr. Trula Slade. Given history and clinical findings. States a venous insufficiency w/u would likely give false positives. Recommends elevating lower extremities and compression hose.  Feels SCDs will be less effective. Will review studies and see pt tomorrow afternoon. Spoke with RN on antepartum and above ordered.

## 2013-12-24 NOTE — H&P (Addendum)
Alice Osborne is a 28 y.o. female G2 P0010 at 108 1/7 weeks admitted due to preeclampsia.  Pt presented to the office for her routine ob visit.  Pt had no complaints expect swelling in her right leg and calf pain x 4 days.   BP 170/110 repeat 160/110.  4+ protein on urine dip. Weight gain of 14 lbs in 2 weeks.  Pt denied headaches, visual changes, SOB or chest pain.  Pt states she saw black spots in vision a few days ago but none currently.  Fetus is active, no LOF, no vaginal bleeding. BP on admission 149/107 then trended down to 148/89.  She remains asymptomatic on bedrest. Pt has pain with ambulation, can no bear weight. Pt had symptoms of DVT in the same leg on 11/20.  LE doppler at that time was negative.  Pt denies h/o trauma to that leg.   Pregnancy has been uncomplicated.  Pt is Rh negative, Rhogam administered on 12/16.  Maternal Medical History:  Reason for admission: Nausea. Elevated BP, 4+ protein  Contractions: None  Fetal activity: Perceived fetal activity is normal.    Prenatal Complications - Diabetes: none.    OB History   Grav Para Term Preterm Abortions TAB SAB Ect Mult Living   2    1  1    0     Past Medical History  Diagnosis Date  . Pregnancy induced hypertension   . GERD (gastroesophageal reflux disease)   . Fibroid    Past Surgical History  Procedure Laterality Date  . No past surgeries     Family History: family history is not on file. Social History:  reports that she has never smoked. She has never used smokeless tobacco. She reports that she does not drink alcohol or use illicit drugs.   Review of Systems  Constitutional: Negative for fever, chills and malaise/fatigue.  Eyes:       Black spots a few day ago but none currently.  Respiratory: Negative for shortness of breath.   Cardiovascular: Positive for leg swelling. Negative for chest pain.  Gastrointestinal: Negative for nausea, vomiting and abdominal pain.  Musculoskeletal:       Right calf  pain  Neurological: Negative for headaches.  Endo/Heme/Allergies: Does not bruise/bleed easily.  Psychiatric/Behavioral: Negative for depression.      Blood pressure 148/103, pulse 73, temperature 98 F (36.7 C), temperature source Oral, resp. rate 18, height 5\' 3"  (1.6 m), weight 97.024 kg (213 lb 14.4 oz), last menstrual period 04/21/2013, unknown if currently breastfeeding. Maternal Exam:  Abdomen: Fetal presentation: vertex  Introitus: Normal vulva. Normal vagina.  Pelvis: adequate for delivery.   Cervix: Cervix evaluated by digital exam.   Closed, thick, moderately soft  Fetal Exam Fetal Monitor Review: Mode: fetoscope.   Variability: moderate (6-25 bpm).   Pattern: accelerations present.    Fetal State Assessment: Category I - tracings are normal.     Physical Exam  Constitutional: She is oriented to person, place, and time. She appears well-developed and well-nourished.  Pt appears to be in her normal state of health.  HENT:  Head: Normocephalic and atraumatic.  Eyes: EOM are normal.  Neck: Normal range of motion. No thyromegaly present.  Cardiovascular: Normal rate, regular rhythm and normal heart sounds.   No murmur heard. Respiratory: Effort normal and breath sounds normal. No respiratory distress.  No crackles.  GI: Soft. There is no tenderness.  No RUQ tenderness, no fundal tenderness  Genitourinary: Vagina normal.  Musculoskeletal: Normal range of  motion. She exhibits edema and tenderness.  Right leg is larger than the left but both are excessively swollen.  +Homen's sign, + Calf tenderness, No discoloration.    Neurological: She is alert and oriented to person, place, and time. She has normal reflexes. She displays normal reflexes.  Reflexes not illicited on right knee but 2+ on left knee.  Skin: Skin is warm and dry. No rash noted. She is not diaphoretic. No erythema.  Psychiatric: She has a normal mood and affect.    Prenatal labs: ABO, Rh: --/--/A NEG  (01/09 1230) Antibody: POS (01/09 1230) Rubella: Immune (07/16 0000) RPR: NON REACTIVE (01/09 1230)  HBsAg: Negative (07/16 0000)  HIV: Non-reactive (07/17 0000)  GBS: Positive (01/09 1534)   CBC, CMP, LDH, Uric Acid normal. Prot/Cr ratio pending.  Pt has not voided  Assessment/Plan: IUP @ 35 2/7 weeks Mild Preeclampsia.  Severe range BPs now mild on Bedrest. Right leg swelling and pain.  R/o DVT.  LE Doppler 11/20 negative. Rh negative  Pt instructed to void.  Prot/Cr ratio stat.  Repeat urine dip. Awaiting arrival of technician to perform LE Doppler.  Defer mechanical DVT prophylaxis at this time. Ultrasound to assess weight and AFI. Rapid GBS culture ordered. Labetalol 10 mg IV ordered for SBP greater than 165, DBP greater than 105. Bedrest with BRP. Low Sodium diet.    [name] 12/24/2013, 9:53 AM

## 2013-12-24 NOTE — Progress Notes (Signed)
242ml of urine was wasted in toilet, not collected for 24 hour urine. Dr. Charlesetta Garibaldi made aware and will continue to collect 24 hour urine sample until 1900 tonight.

## 2013-12-24 NOTE — Progress Notes (Addendum)
Patient ID: Alice Osborne, female   DOB: 09-Mar-1986, 28 y.o.   MRN: [id number]  In to assess pt and review w/u.  Pt states she is comfortable except for right leg pain.  Fetus is active.  Denies headaches, visual changes, RUQ pain.  Pt concerned about right leg.  Pt states the pain has "never lasted this long."  She gives a history of a similar episode of pain 1 year prior to her first pregnancy.  She had right calf pain but no swelling.  Lasted for 1 day, limped due to pain.  Spontaneously resolved.  Pt has never sought medical attention for it.  Pt's mother has varicose veins also.  BPs 130s-140s/80-90s. Gen:  NAD, comfortable Neuro: DTRs normal  Fetal Monitoring:  Cat 1, no contractions.  LE Doppler, verbal report- Neg for DVT, neg Baker's cyst, varicose veins behind right knee Protein/Cr ration 0.9, Urine dip 300+  A/P Mild Preeclampsia  Continue inpatient bedrest w/ BRPs.  Bedside commode ordered.  Start 24 hour urine collection.  Repeat PIH labs in the am. Right leg pain and swelling.  Based on previous history, she probably has poor valves in the varicose veins.  SCDs will likely be very painful.  Consult Vascular for recommendations on pain management and to make sure DVT has been adequately ruled out.

## 2013-12-25 LAB — CBC
HCT: 29.7 % — ABNORMAL LOW (ref 36.0–46.0)
HEMOGLOBIN: 10.6 g/dL — AB (ref 12.0–15.0)
MCH: 31.5 pg (ref 26.0–34.0)
MCHC: 35.7 g/dL (ref 30.0–36.0)
MCV: 88.1 fL (ref 78.0–100.0)
PLATELETS: 128 10*3/uL — AB (ref 150–400)
RBC: 3.37 MIL/uL — AB (ref 3.87–5.11)
RDW: 13.1 % (ref 11.5–15.5)
WBC: 6.4 10*3/uL (ref 4.0–10.5)

## 2013-12-25 MED ORDER — LABETALOL HCL 5 MG/ML IV SOLN
10.0000 mg | INTRAVENOUS | Status: DC | PRN
  Filled 2013-12-25: qty 4

## 2013-12-25 MED ORDER — OXYTOCIN 40 UNITS IN LACTATED RINGERS INFUSION - SIMPLE MED
1.0000 m[IU]/min | INTRAVENOUS | Status: DC
  Administered 2013-12-25: 1 m[IU]/min via INTRAVENOUS

## 2013-12-25 MED ORDER — TERBUTALINE SULFATE 1 MG/ML IJ SOLN
0.2500 mg | Freq: Once | INTRAMUSCULAR | Status: AC | PRN
Start: 2013-12-25 — End: 2013-12-25

## 2013-12-25 NOTE — Progress Notes (Signed)
  Subjective: Aware of contractions more now, some painful.  Ate light meal, tolerated well.  Objective: BP 157/97  Pulse 73  Temp(Src) 98.3 F (36.8 C) (Oral)  Resp 18  Ht 5\' 3"  (1.6 m)  Wt 213 lb 14.4 oz (97.024 kg)  BMI 37.90 kg/m2  LMP 04/21/2013 I/O last 3 completed shifts: In: 2257.3 [P.O.:1290; I.V.:967.3] Out: 1625 [Urine:1625]    FHT: Category 1 UC:   regular, every q 3-4 minutes SVE:   Dilation: 1 Effacement (%): 50 Station: -2 Exam by:: Haddie Bruhl, CNM Foley bulb inserted without difficulty, inflated with 50 cc.  Assessment / Plan: Induction of labor for severe pre-eclampsia Latent phase GBS positive Will observe for extrusion of foley bulb, then start pitocin.  Alice Osborne, [place] 12/25/2013, 2:10 PM

## 2013-12-25 NOTE — Progress Notes (Addendum)
  Subjective: Foley bulb fell out with trip to BR.  No significant UCs recently.  Just took Ambien.  Objective: BP 149/113  Pulse 80  Temp(Src) 98 F (36.7 C) (Oral)  Resp 18  Ht 5\' 3"  (1.6 m)  Wt 213 lb 14.4 oz (97.024 kg)  BMI 37.90 kg/m2  LMP 04/21/2013 I/O last 3 completed shifts: In: 852 [P.O.:930; I.V.:9] Out: 725 [Urine:725]  Last BP taken just after exam  FHT:  Category 1 UC:   Irregular, mild SVE:  4 cm, 50%, vtx, -2  Assessment / Plan: Induction of labor--severe pre-eclampsia Foley bulb extruded Will start pitocin at 1 mu/min, hold there at present to allow patient to rest during night if possible.  Plan beginning to increase pitocin around 5am. Begin GBS prophylaxis then. Repeat PIH labs in am. Start magnesium with initiation of pitocin increases.  Donnel Saxon 12/25/2013, 11:23 PM

## 2013-12-25 NOTE — Consult Note (Signed)
Consult Note  Patient name: Alice Osborne MRN: [id number] DOB: 04-26-1986 Sex: female  Consulting Physician:  OB  Reason for Consult: No chief complaint on file.   HISTORY OF PRESENT ILLNESS: This is a 28 year old female who was admitted for preeclampsia.  She complains of severe swelling and pain in her right leg.  The pain is in her right calf.  It is exacerbated by pulling her toes up toward her knees.  She states that she has had episodes in the past where she has swelling and pain but easily last day or 2 and resolved.  This time the pain has continued.  There are no relieving maneuvers.  She has had 2 ultrasounds which have been negative for DVT.  She reports no history of DVT.  There is no history of DVT or PE or hypercoagulable state in the family.  Her ultrasound did identify varicose veins in the groin and calf area.  The patient is a nonsmoker.  Past Medical History  Diagnosis Date  . Pregnancy induced hypertension   . GERD (gastroesophageal reflux disease)   . Fibroid     Past Surgical History  Procedure Laterality Date  . No past surgeries      History   Social History  . Marital Status: Married    Spouse Name: N/A    Number of Children: N/A  . Years of Education: N/A   Occupational History  . Not on file.   Social History Main Topics  . Smoking status: Never Smoker   . Smokeless tobacco: Never Used  . Alcohol Use: No  . Drug Use: No  . Sexual Activity: Yes    Birth Control/ Protection: None   Other Topics Concern  . Not on file   Social History Narrative  . No narrative on file    History reviewed. No pertinent family history.  Allergies as of 12/23/2013  . (No Known Allergies)    No current facility-administered medications on file prior to encounter.   Current Outpatient Prescriptions on File Prior to Encounter  Medication Sig Dispense Refill  . acetaminophen (TYLENOL) 500 MG tablet Take 500 mg by mouth every 6 (six) hours as needed  for pain.      . Doxylamine-Pyridoxine (DICLEGIS) 10-10 MG TBEC Take 2 tablets by mouth at bedtime. If symptoms persist, add 1 tablet every morning starting Day 3.  If symptoms persist, add 1 tablet every afternoon starting Day 4  100 tablet  3  . Prenatal Vit-Fe Fumarate-FA (PRENATAL MULTIVITAMIN) TABS Take 1 tablet by mouth daily at 12 noon.      . promethazine (PHENERGAN) 25 MG tablet Take 1 tablet (25 mg total) by mouth every 6 (six) hours as needed for nausea.  30 tablet  0     REVIEW OF SYSTEMS: Cardiovascular: No chest pain, no shortness of breath.  Right leg swelling Pulmonary: No productive cough, asthma or wheezing. Neurologic: No weakness, paresthesias, aphasia, or amaurosis. No dizziness. Hematologic: Positive for headache Musculoskeletal: Pain in the right calf and Achilles area Gastrointestinal: No blood in stool or hematemesis Genitourinary: No dysuria or hematuria. Psychiatric:: No history of major depression. Integumentary: No rashes or ulcers. Constitutional: No fever or chills.  PHYSICAL EXAMINATION: General: The patient appears their stated age.  Vital signs are BP 155/92  Pulse 72  Temp(Src) 97.4 F (36.3 C) (Oral)  Resp 20  Ht 5\' 3"  (1.6 m)  Wt 213 lb 14.4 oz (97.024 kg)  BMI  37.90 kg/m2  LMP 04/21/2013 Pulmonary: Respirations are non-labored HEENT:  No gross abnormalities Abdomen: Pregnant Musculoskeletal: Calf pain on the right with palpation.  She also has pain in the Achilles tendon area..   Neurologic: Normal motor and sensory function in the bilateral lower extremities, Skin: There are no ulcer or rashes noted. Psychiatric: The patient has normal affect. Cardiovascular: There is a regular rate and rhythm without significant murmur appreciated.  Palpable pedal pulses.  Significantly swollen bilateral lower extremity with the right side being much greater than the left.  Diagnostic Studies: Duplex ultrasound reveals no evidence of deep vein thrombosis.   She does have varicosities in the groin and calf area.    Assessment:  Bilateral leg swelling, right greater than left Plan: Ultrasound was negative for DVT.  The patient has no history of DVT or PE.  In addition, there is no family history for hypercoagulable state.  Despite the leg swelling, I do not feel there is any role for anticoagulation at this time.  I feel that she needs a symptomatic control.  The best way to manage this is going to be with right leg elevation, as high as she can get it.  This may be difficult given the fact that she is [redacted] weeks pregnant.  She may also benefit from positional changes. Most likely, there is compression by the baby on her iliac veins on the right, causing him 2 units to venous return.  Left lateral decubitus may help relieve some of this compression.  In addition, she could try to place a TED hose on the right to give her some mild compression to help get some of the fluid out of her leg.  This was discussed with the patient and she will attempt these maneuvers.  I told her that once she has delivered, if her symptoms persist, she would benefit from an outpatient venous insufficiency workup.  I will schedule her to followup with me in approximately 6 weeks with a venous insufficiency ultrasound.  Please contact me with any additional questions.     Eldridge Abrahams, M.D. [facility] Office: [phone] Pager:  [phone]

## 2013-12-25 NOTE — Progress Notes (Signed)
  Subjective: Resting on right side--reports UCs stronger.  Objective: BP 148/88  Pulse 75  Temp(Src) 98.3 F (36.8 C) (Oral)  Resp 16  Ht 5\' 3"  (1.6 m)  Wt 213 lb 14.4 oz (97.024 kg)  BMI 37.90 kg/m2  LMP 04/21/2013 I/O last 3 completed shifts: In: 2257.3 [P.O.:1290; I.V.:967.3] Out: 1625 [Urine:1625]    FHT:  Category 1 UC:   q 4-6 min, mild/moderate SVE:   Foley bulb still in place  Assessment / Plan: Induction for severe pre-eclampsia Foley bulb in place GBS positive Will await foley bulb extrusion. Start pitocin and GBS prophylaxis then.  Donnel Saxon 12/25/2013, 6:47 PM

## 2013-12-25 NOTE — Progress Notes (Signed)
  Subjective: Sleeping at intervals--no pain at present.  Family at bedside.  Objective: BP 126/86  Pulse 74  Temp(Src) 98 F (36.7 C) (Oral)  Resp 16  Ht 5\' 3"  (1.6 m)  Wt 213 lb 14.4 oz (97.024 kg)  BMI 37.90 kg/m2  LMP 04/21/2013 I/O last 3 completed shifts: In: 376 [P.O.:930; I.V.:9] Out: 725 [Urine:725]   Filed Vitals:   12/25/13 1846 12/25/13 1912 12/25/13 2006 12/25/13 2106  BP: 165/99 149/97 138/85 126/86  Pulse: 84 74 72 74  Temp:   98 F (36.7 C)   TempSrc:      Resp:  18 16 16   Height:      Weight:        FHT: Category 1 UC:   irregular, every 4-6 minutes SVE:   Deferred Foley bulb still in place (inserted at 1443)  Assessment / Plan: Induction for severe pre-eclampsia Foley bulb in place Will CTO at present.  Donnel Saxon 12/25/2013, 9:10 PM

## 2013-12-25 NOTE — Progress Notes (Addendum)
  Subjective: Comfortable.  Husband at bedside.  Objective: BP 169/112  Pulse 76  Temp(Src) 97.4 F (36.3 C) (Oral)  Resp 18  Ht 5\' 3"  (1.6 m)  Wt 213 lb 14.4 oz (97.024 kg)  BMI 37.90 kg/m2  LMP 04/21/2013 I/O last 3 completed shifts: In: 2257.3 [P.O.:1290; I.V.:967.3] Out: 1625 [Urine:1625]   Filed Vitals:   12/25/13 0302 12/25/13 0558 12/25/13 0741 12/25/13 0946  BP: 138/90 156/95 155/92 169/112  Pulse: 87 69 72 76  Temp: 98 F (36.7 C) 97.5 F (36.4 C) 97.4 F (36.3 C)   TempSrc: Oral Oral Oral   Resp: 18  20 18   Height:      Weight:       Did not receive 6am Labetalol 200 mg dose--given 0937. Received initial dose of [employer] at 2056--contracted too much for f/u doses.  GBS positive from 1/9 admission.  Seen by vascular MD--no evidence DVT.  Recommended f/u at 6 weeks pp for venous insufficiency [place].  FHT:  Category 1 UC:   irregular, every 2-4 minutes, mild SVE: Deferred at present Last exam 1 cm, 50%, vtx, at 0408  Assessment / Plan: Induction for severe pre-eclampsia GBS positive  Plan: Allow light breakfast. Recheck cervix after that--may place foley bulb if appropriate.  If cervix more favorable than previous exam, may start pitocin. Plan GBS prophylaxis with onset of labor. Plan Magnesium sulfate with onset of labor.  Donnel Saxon 12/25/2013, 9:57 AM

## 2013-12-26 ENCOUNTER — Inpatient Hospital Stay (HOSPITAL_COMMUNITY): Payer: BC Managed Care – PPO | Admitting: Anesthesiology

## 2013-12-26 ENCOUNTER — Encounter (HOSPITAL_COMMUNITY): Payer: Self-pay | Admitting: Anesthesiology

## 2013-12-26 ENCOUNTER — Other Ambulatory Visit: Payer: Self-pay | Admitting: *Deleted

## 2013-12-26 ENCOUNTER — Encounter (HOSPITAL_COMMUNITY): Payer: BC Managed Care – PPO | Admitting: Anesthesiology

## 2013-12-26 DIAGNOSIS — R600 Localized edema: Secondary | ICD-10-CM

## 2013-12-26 LAB — CBC
HCT: 31.6 % — ABNORMAL LOW (ref 36.0–46.0)
HEMATOCRIT: 30.9 % — AB (ref 36.0–46.0)
HEMATOCRIT: 33.1 % — AB (ref 36.0–46.0)
HEMOGLOBIN: 11.6 g/dL — AB (ref 12.0–15.0)
Hemoglobin: 11.1 g/dL — ABNORMAL LOW (ref 12.0–15.0)
Hemoglobin: 11.3 g/dL — ABNORMAL LOW (ref 12.0–15.0)
MCH: 31.8 pg (ref 26.0–34.0)
MCH: 31.3 pg (ref 26.0–34.0)
MCH: 31.1 pg (ref 26.0–34.0)
MCHC: 35.9 g/dL (ref 30.0–36.0)
MCHC: 35.8 g/dL (ref 30.0–36.0)
MCHC: 35 g/dL (ref 30.0–36.0)
MCV: 88.5 fL (ref 78.0–100.0)
MCV: 87.5 fL (ref 78.0–100.0)
MCV: 88.7 fL (ref 78.0–100.0)
Platelets: 132 10*3/uL — ABNORMAL LOW (ref 150–400)
Platelets: 139 10*3/uL — ABNORMAL LOW (ref 150–400)
Platelets: 140 10*3/uL — ABNORMAL LOW (ref 150–400)
RBC: 3.49 MIL/uL — ABNORMAL LOW (ref 3.87–5.11)
RBC: 3.61 MIL/uL — AB (ref 3.87–5.11)
RBC: 3.73 MIL/uL — ABNORMAL LOW (ref 3.87–5.11)
RDW: 13.3 % (ref 11.5–15.5)
RDW: 13.2 % (ref 11.5–15.5)
RDW: 13.3 % (ref 11.5–15.5)
WBC: 7 10*3/uL (ref 4.0–10.5)
WBC: 7.2 10*3/uL (ref 4.0–10.5)
WBC: 7.6 10*3/uL (ref 4.0–10.5)

## 2013-12-26 LAB — COMPREHENSIVE METABOLIC PANEL
ALBUMIN: 2 g/dL — AB (ref 3.5–5.2)
ALBUMIN: 2.1 g/dL — AB (ref 3.5–5.2)
ALK PHOS: 111 U/L (ref 39–117)
ALT: 32 U/L (ref 0–35)
ALT: 35 U/L (ref 0–35)
AST: 46 U/L — ABNORMAL HIGH (ref 0–37)
AST: 49 U/L — ABNORMAL HIGH (ref 0–37)
Alkaline Phosphatase: 108 U/L (ref 39–117)
BUN: 20 mg/dL (ref 6–23)
BUN: 19 mg/dL (ref 6–23)
CHLORIDE: 100 meq/L (ref 96–112)
CO2: 21 mEq/L (ref 19–32)
CO2: 22 mEq/L (ref 19–32)
Calcium: 9.2 mg/dL (ref 8.4–10.5)
Calcium: 8.3 mg/dL — ABNORMAL LOW (ref 8.4–10.5)
Chloride: 103 mEq/L (ref 96–112)
Creatinine, Ser: 0.6 mg/dL (ref 0.50–1.10)
Creatinine, Ser: 0.7 mg/dL (ref 0.50–1.10)
GFR calc Af Amer: >90 mL/min (ref >90)
GFR calc non Af Amer: >90 mL/min (ref >90)
GFR calc non Af Amer: >90 mL/min (ref >90)
GLUCOSE: 85 mg/dL (ref 70–99)
Glucose, Bld: 75 mg/dL (ref 70–99)
POTASSIUM: 4.7 meq/L (ref 3.7–5.3)
Potassium: 4.7 mEq/L (ref 3.7–5.3)
SODIUM: 135 meq/L — AB (ref 137–147)
SODIUM: 132 meq/L — AB (ref 137–147)
TOTAL PROTEIN: 5.4 g/dL — AB (ref 6.0–8.3)
Total Bilirubin: <0.2 mg/dL — ABNORMAL LOW (ref 0.3–1.2)
Total Protein: 5.7 g/dL — ABNORMAL LOW (ref 6.0–8.3)

## 2013-12-26 LAB — TYPE AND SCREEN
ABO/RH(D): A NEG
Antibody Screen: POSITIVE
DAT, IgG: NEGATIVE
UNIT DIVISION: 0
Unit division: 0

## 2013-12-26 LAB — MAGNESIUM: Magnesium: 5.3 mg/dL — ABNORMAL HIGH (ref 1.5–2.5)

## 2013-12-26 LAB — URIC ACID
Uric Acid, Serum: 6.8 mg/dL (ref 2.4–7.0)
Uric Acid, Serum: 7 mg/dL (ref 2.4–7.0)

## 2013-12-26 LAB — LACTATE DEHYDROGENASE
LDH: 228 U/L (ref 94–250)
LDH: 251 U/L — ABNORMAL HIGH (ref 94–250)

## 2013-12-26 MED ORDER — HYDRALAZINE HCL 20 MG/ML IJ SOLN: 5.0000 mg | INTRAMUSCULAR | Status: DC | PRN

## 2013-12-26 MED ORDER — LIDOCAINE HCL (PF) 1 % IJ SOLN
INTRAMUSCULAR | Status: DC | PRN
  Administered 2013-12-26 (×2): 9 mL

## 2013-12-26 MED ORDER — PENICILLIN G POTASSIUM 5000000 UNITS IJ SOLR
5.0000 10*6.[IU] | Freq: Once | INTRAVENOUS | Status: AC
  Administered 2013-12-26: 5 10*6.[IU] via INTRAVENOUS
  Filled 2013-12-26: qty 5

## 2013-12-26 MED ORDER — MAGNESIUM SULFATE 40 G IN LACTATED RINGERS - SIMPLE
2.0000 g/h | INTRAVENOUS | Status: AC
  Administered 2013-12-27: 2 g/h via INTRAVENOUS
  Filled 2013-12-26 (×2): qty 500

## 2013-12-26 MED ORDER — PENICILLIN G POTASSIUM 5000000 UNITS IJ SOLR
2.5000 10*6.[IU] | INTRAMUSCULAR | Status: DC
  Filled 2013-12-26 (×3): qty 2.5

## 2013-12-26 MED ORDER — CALCIUM CARBONATE ANTACID 500 MG PO CHEW
400.0000 mg | CHEWABLE_TABLET | Freq: Three times a day (TID) | ORAL | Status: DC | PRN
  Administered 2013-12-26: 400 mg via ORAL
  Filled 2013-12-26: qty 1

## 2013-12-26 MED ORDER — PHENYLEPHRINE 40 MCG/ML (10ML) SYRINGE FOR IV PUSH (FOR BLOOD PRESSURE SUPPORT)
80.0000 ug | PREFILLED_SYRINGE | INTRAVENOUS | Status: DC | PRN
  Filled 2013-12-26: qty 2
  Filled 2013-12-26: qty 10

## 2013-12-26 MED ORDER — OXYTOCIN 40 UNITS IN LACTATED RINGERS INFUSION - SIMPLE MED: 1.0000 m[IU]/min | INTRAVENOUS | Status: DC

## 2013-12-26 MED ORDER — EPHEDRINE 5 MG/ML INJ
10.0000 mg | INTRAVENOUS | Status: DC | PRN
  Filled 2013-12-26: qty 2

## 2013-12-26 MED ORDER — BUTORPHANOL TARTRATE 1 MG/ML IJ SOLN: 1.0000 mg | INTRAMUSCULAR | Status: DC | PRN

## 2013-12-26 MED ORDER — FENTANYL CITRATE 0.05 MG/ML IJ SOLN
100.0000 ug | Freq: Once | INTRAMUSCULAR | Status: DC
  Filled 2013-12-26: qty 2

## 2013-12-26 MED ORDER — DEXTROSE 5 % IV SOLN
5.0000 10*6.[IU] | Freq: Once | INTRAVENOUS | Status: DC
  Filled 2013-12-26: qty 5

## 2013-12-26 MED ORDER — PHENYLEPHRINE 40 MCG/ML (10ML) SYRINGE FOR IV PUSH (FOR BLOOD PRESSURE SUPPORT)
80.0000 ug | PREFILLED_SYRINGE | INTRAVENOUS | Status: DC | PRN
  Filled 2013-12-26: qty 2

## 2013-12-26 MED ORDER — FENTANYL 2.5 MCG/ML BUPIVACAINE 1/10 % EPIDURAL INFUSION (WH - ANES)
INTRAMUSCULAR | Status: DC | PRN
  Administered 2013-12-26: 14 mL/h via EPIDURAL

## 2013-12-26 MED ORDER — FENTANYL CITRATE 0.05 MG/ML IJ SOLN
INTRAMUSCULAR | Status: DC | PRN
  Administered 2013-12-26 (×2): 50 ug via INTRAVENOUS

## 2013-12-26 MED ORDER — EPHEDRINE 5 MG/ML INJ
10.0000 mg | INTRAVENOUS | Status: DC | PRN
  Filled 2013-12-26: qty 2
  Filled 2013-12-26: qty 4

## 2013-12-26 MED ORDER — LACTATED RINGERS IV SOLN: 500.0000 mL | Freq: Once | INTRAVENOUS | Status: DC

## 2013-12-26 MED ORDER — MAGNESIUM SULFATE BOLUS VIA INFUSION
4.0000 g | Freq: Once | INTRAVENOUS | Status: AC
  Administered 2013-12-26: 4 g via INTRAVENOUS
  Filled 2013-12-26: qty 500

## 2013-12-26 MED ORDER — BUPIVACAINE HCL (PF) 0.25 % IJ SOLN
INTRAMUSCULAR | Status: DC | PRN
  Administered 2013-12-26 (×2): 4 mL

## 2013-12-26 MED ORDER — DIPHENHYDRAMINE HCL 50 MG/ML IJ SOLN
12.5000 mg | INTRAMUSCULAR | Status: DC | PRN
  Administered 2013-12-26: 12.5 mg via INTRAVENOUS
  Filled 2013-12-26: qty 1

## 2013-12-26 MED ORDER — FENTANYL 2.5 MCG/ML BUPIVACAINE 1/10 % EPIDURAL INFUSION (WH - ANES)
14.0000 mL/h | INTRAMUSCULAR | Status: DC | PRN
  Administered 2013-12-26: 14 mL/h via EPIDURAL
  Filled 2013-12-26 (×2): qty 125

## 2013-12-26 MED ORDER — PENICILLIN G POTASSIUM 5000000 UNITS IJ SOLR
2.5000 10*6.[IU] | INTRAVENOUS | Status: DC
  Administered 2013-12-26 (×4): 2.5 10*6.[IU] via INTRAVENOUS
  Filled 2013-12-26 (×8): qty 2.5

## 2013-12-26 NOTE — Progress Notes (Signed)
  Subjective: Pt is in good spirits, happy with labor progress. Epidural is giving good pain management.  Discussed pt's labor and delivery expectations.  Objective: BP 135/88  Pulse 78  Temp(Src) 97.5 F (36.4 C) (Axillary)  Resp 16  Ht 5\' 3"  (1.6 m)  Wt 213 lb 14.4 oz (97.024 kg)  BMI 37.90 kg/m2  SpO2 98%  LMP 04/21/2013 I/O last 3 completed shifts: In: 2665.3 [P.O.:665; I.V.:1750.3; IV Piggyback:250] Out: 9381 [Urine:1335]    FHT:  Cat I UC:   regular, every 2-3 minutes  SVE:   Dilation: 6 Effacement (%): 70 Station: 0 Exam by:: varnado  Assessment / Plan:  Induction of labor due to preeclampsia,  progressing well on pitocin  Labor: Progressing on Pitocin; Pit on 23 miliU; MVUs at 200 Preeclampsia: on magnesium sulfate, no signs or symptoms of toxicity and will continue to monitor UOP  Fetal Wellbeing: Category I  Pain Control: Epidural  I/D: GBS pos; PCN G s/p 4 doses; AROM at 1330; Afebrile Anticipated MOD: NSVD   Legna Mausolf 12/26/2013, 7:42 PM

## 2013-12-26 NOTE — Progress Notes (Signed)
Alice Osborne is a 28 y.o. G2P0010 at [redacted]w[redacted]d   Subjective: Pt without complaints.  Just took Tylenol for headache and it is relieving it.  Had yellow mucoid discharge that she thought was her amniontic fluid but RN informed her it was just her mucus plug. Denies RUQ, bloody show. Voided at [place].  Objective: BP 142/97  Pulse 77  Temp(Src) 98.5 F (36.9 C) (Oral)  Resp 18  Ht 5\' 3"  (1.6 m)  Wt 97.024 kg (213 lb 14.4 oz)  BMI 37.90 kg/m2  SpO2 99%  LMP 04/21/2013 I/O last 3 completed shifts: In: 1490.3 [P.O.:240; I.V.:1000.3; IV Piggyback:250] Out: 100 [Urine:100] Total I/O In: 75 [I.V.:75] Out: 200 [Urine:200] x 2 hours  FHT:  140s, accels, good variability. UC:   irregular SVE:   Dilation: 4 Effacement (%): 50 Station: -2 Exam by:: [place] CNM Cvx unchanged by my exam.  Platelets stable at 132.  AST 46. 24 hour urine 12,000+ [place] results pending.  Labs: Lab Results  Component Value Date   WBC 7.0 12/26/2013   HGB 11.1* 12/26/2013   HCT 30.9* 12/26/2013   MCV 88.5 12/26/2013   PLT 132* 12/26/2013    Assessment / Plan: Severe preeclampsia at 35 4/7 weeks-Induced due to severe range BP. BP well controlled with Labetalol po 100 mg TID.  Has not required Labetalol IV. LFTs and platelets mildly affected. Tolerating Magnesium sulfate.  Will monitor UOP closely.  Check Magnesium level if UOP decreases. Right leg pain and swelling.  Appreciate Vascular consult.  Still requiring bedside commode. GBS+ on PCN.  Labor: Pitocin modified to be increased by 2 mUs by 2 mUs.  AROM if -2 at next check and cervix has effaced. Preeclampsia:  on magnesium sulfate.  Repeat labs 12 hours Fetal Wellbeing:  Category I Pain Control:  Epidural, Stadol prn I/D:  GBS+.  Second dose due at 1000. Anticipated MOD:  NSVD  Alice Osborne 12/26/2013, 8:44 AM

## 2013-12-26 NOTE — Progress Notes (Addendum)
Alice Osborne is a 28 y.o. G2P0010 at [redacted]w[redacted]d   Subjective: Pt comfortable s/p epidural.  Denies headache, visual changes, RUQ pain.  Fetus is still active.  Objective: BP 109/93  Pulse 90  Temp(Src) 98.5 F (36.9 C) (Oral)  Resp 16  Ht 5\' 3"  (1.6 m)  Wt 97.024 kg (213 lb 14.4 oz)  BMI 37.90 kg/m2  SpO2 98%  LMP 04/21/2013 I/O last 3 completed shifts: In: 1490.3 [P.O.:240; I.V.:1000.3; IV Piggyback:250] Out: 100 [Urine:100] Total I/O In: 925 [P.O.:275; I.V.:650] Out: 1100 [Urine:1100] UOP chronologically 200/200/200/250/150/50/50  FHT:  130s, accels, no decels, good variability UC:   q 2-4 min, not tracing optimally  Gen:  NAD, comfortable CV:  RRR Lungs:  CTA bilaterally Abdomen:  No RUQ pain. Neuro 2+DTR.  SVE:   Dilation: 6 Effacement (%): 70 Station: 0 Exam by:: Majour Frei Bloody show.  IUPC placed posteriorly to assess contractions better.  Labs: Lab Results  Component Value Date   WBC 7.6 12/26/2013   HGB 11.6* 12/26/2013   HCT 33.1* 12/26/2013   MCV 88.7 12/26/2013   PLT 140* 12/26/2013  Platelets 140 AST 48.  Uric acid 6.0 to 7.0, Cr 0.6 to 0.7 Magnesium 5.3  Ultrasound:  6 lb 10 oz.  AFI 13.  Assessment / Plan: Severe Preeclampsia at 35 4/7 weeks. BPs normal to mild s/p epidural.  Labetalol 200 mg to be held if BP less than 120/70. UOP has been good.  Decreased in the last 2 hours.  Monitor closely. No s/sxs of magnesium toxicity.  Magnesium level is adequate. Pt informed she would be on Magnesium 24 hours s/p delivery in AICU.  Labor: Progressing normally  Active Labor Preeclampsia:  on magnesium sulfate and labs stable Fetal Wellbeing:  Category I Pain Control:  Epidural I/D:  GBS+  S/p 3 doses of PCN. Anticipated MOD:  NSVD  Checked out to Dr. Nelda Marseille.  [name] 12/26/2013, 6:17 PM

## 2013-12-26 NOTE — Anesthesia Preprocedure Evaluation (Signed)
Anesthesia Evaluation  Patient identified by MRN, date of birth, ID band Patient awake    Reviewed: Allergy & Precautions, H&P , NPO status , Patient's Chart, lab work & pertinent test results  Airway Mallampati: II TM Distance: >3 FB Neck ROM: full    Dental no notable dental hx.    Pulmonary neg pulmonary ROS,    Pulmonary exam normal       Cardiovascular hypertension, Pt. on medications     Neuro/Psych negative neurological ROS  negative psych ROS   GI/Hepatic negative GI ROS, Neg liver ROS,   Endo/Other  negative endocrine ROSMorbid obesity  Renal/GU negative Renal ROS     Musculoskeletal   Abdominal (+) + obese,   Peds  Hematology negative hematology ROS (+)   Anesthesia Other Findings   Reproductive/Obstetrics (+) Pregnancy                           Anesthesia Physical Anesthesia Plan  ASA: III  Anesthesia Plan: Epidural   Post-op Pain Management:    Induction:   Airway Management Planned:   Additional Equipment:   Intra-op Plan:   Post-operative Plan:   Informed Consent: I have reviewed the patients History and Physical, chart, labs and discussed the procedure including the risks, benefits and alternatives for the proposed anesthesia with the patient or authorized representative who has indicated his/her understanding and acceptance.     Plan Discussed with:   Anesthesia Plan Comments:         Anesthesia Quick Evaluation

## 2013-12-26 NOTE — Anesthesia Procedure Notes (Signed)
Epidural Patient location during procedure: OB Start time: 12/26/2013 2:32 PM End time: 12/26/2013 2:36 PM  Staffing Anesthesiologist: Lyn Hollingshead Performed by: anesthesiologist   Preanesthetic Checklist Completed: patient identified, surgical consent, pre-op evaluation, timeout performed, IV checked, risks and benefits discussed and monitors and equipment checked  Epidural Patient position: sitting Prep: site prepped and draped and DuraPrep Patient monitoring: continuous pulse ox and blood pressure Approach: midline Injection technique: LOR air  Needle:  Needle type: Tuohy  Needle gauge: 17 G Needle length: 9 cm and 9 Needle insertion depth: 6 cm Catheter type: closed end flexible Catheter size: 19 Gauge Catheter at skin depth: 11 cm Test dose: negative and Other  Assessment Sensory level: T9 Events: blood not aspirated, injection not painful, no injection resistance, negative IV test and no paresthesia  Additional Notes Reason for block:procedure for pain

## 2013-12-26 NOTE — Progress Notes (Signed)
  Subjective: Slept a little, but now awake and excited about the prospect of hopefully delivering today.  Objective: BP 167/106  Pulse 73  Temp(Src) 98 F (36.7 C) (Oral)  Resp 16  Ht 5\' 3"  (1.6 m)  Wt 213 lb 14.4 oz (97.024 kg)  BMI 37.90 kg/m2  LMP 04/21/2013 I/O last 3 completed shifts: In: 245 [P.O.:930; I.V.:9] Out: 725 [Urine:725]  Most recent BP taken while patient laughing and talking.  Filed Vitals:   12/26/13 0430 12/26/13 0440 12/26/13 0500 12/26/13 0530  BP: 152/104 147/85 135/93 167/106  Pulse: 86 73 71 73  Temp:      TempSrc:      Resp: 16     Height:      Weight:       BP range during night 125-152/82-104.  On Labetalol 200 mg po TID--LD 2208.    FHT:  Category 1 through night, currently in sleep cycle since about 0520 UC:   Irregular, mild. SVE:   Deferred at present--will check before 7am for update  Assessment / Plan: Induction for severe pre-eclampsia GBS positive  Plan: Start increasing pitocin per low dose protocol. Start GBS prophylaxis Start magnesium sulfate Repeat PIH labs. Reviewed plan of care with patient, including recommendation for epidural for pain management. Dr. Cindie Laroche MDs will care for patient today.  Donnel Saxon 12/26/2013, 5:49 AM

## 2013-12-26 NOTE — Progress Notes (Signed)
  Subjective: Pt very uncomfortable with feelings of pressure.  Anesthesia has re-dosed epidural.  Objective: BP 125/77  Pulse 84  Temp(Src) 98.1 F (36.7 C) (Axillary)  Resp 20  Ht 5\' 3"  (1.6 m)  Wt 213 lb 14.4 oz (97.024 kg)  BMI 37.90 kg/m2  SpO2 98%  LMP 04/21/2013 I/O last 3 completed shifts: In: 3221.1 [P.O.:665; I.V.:2306.1; IV Piggyback:250] Out: 9741 [Urine:1335] Total I/O In: 1065.6 [P.O.:608; I.V.:357.6; IV Piggyback:100] Out: 1100 [Urine:800; Emesis/NG output:300]  FHT:  Cat I UC:   regular, every 2.5-4 minutes  SVE:   Dilation: 10 Effacement (%): 100 Station: +1 Exam by:: N Architectural technologist and J Madysyn Hanken RN  Assessment / Plan: Induction of labor due to preeclampsia  Labor: Progressing normally  Preeclampsia: on magnesium sulfate, no signs or symptoms of toxicity and intake and ouput balanced  Fetal Wellbeing: Category I  Pain Control: Epidural  I/D: GBS pos; PCN; Afebrile  Anticipated MOD: NSVD  Yazeed Pryer 12/26/2013, 11:24 PM

## 2013-12-26 NOTE — Progress Notes (Signed)
  Subjective: Feeling "hot" since magnesium started at 0628.  Aware of occasional contractions.  Denies HA, visual sx, or epigastric pain.  Objective: BP 151/99  Pulse 75  Temp(Src) 97.5 F (36.4 C) (Oral)  Resp 18  Ht 5\' 3"  (1.6 m)  Wt 213 lb 14.4 oz (97.024 kg)  BMI 37.90 kg/m2  SpO2 99%  LMP 04/21/2013 I/O last 3 completed shifts: In: 963.8 [P.O.:120; I.V.:843.8] Out: 100 [Urine:100]   No IV Labetalol required during night.  FHT: Category 1 UC:   irregular, every 4-7 minutes, mild SVE:   Dilation: 4 Effacement (%): 50 Station: -2 Exam by:: Cira Servant CNM Cervix posterior, soft. Pitocin on 4 mu/min  Results for orders placed during the hospital encounter of 12/23/13 (from the past 24 hour(s))  CBC     Status: Abnormal   Collection Time    12/26/13  5:40 AM      Result Value Range   WBC 7.0  4.0 - 10.5 K/uL   RBC 3.49 (*) 3.87 - 5.11 MIL/uL   Hemoglobin 11.1 (*) 12.0 - 15.0 g/dL   HCT 30.9 (*) 36.0 - 46.0 %   MCV 88.5  78.0 - 100.0 fL   MCH 31.8  26.0 - 34.0 pg   MCHC 35.9  30.0 - 36.0 g/dL   RDW 13.3  11.5 - 15.5 %   Platelets 132 (*) 150 - 400 K/uL  COMPREHENSIVE METABOLIC PANEL     Status: Abnormal   Collection Time    12/26/13  5:40 AM      Result Value Range   Sodium 135 (*) 137 - 147 mEq/L   Potassium 4.7  3.7 - 5.3 mEq/L   Chloride 103  96 - 112 mEq/L   CO2 21  19 - 32 mEq/L   Glucose, Bld 75  70 - 99 mg/dL   BUN 20  6 - 23 mg/dL   Creatinine, Ser 0.60  0.50 - 1.10 mg/dL   Calcium 9.2  8.4 - 10.5 mg/dL   Total Protein 5.4 (*) 6.0 - 8.3 g/dL   Albumin 2.0 (*) 3.5 - 5.2 g/dL   AST 46 (*) 0 - 37 U/L   ALT 32  0 - 35 U/L   Alkaline Phosphatase 108  39 - 117 U/L   Total Bilirubin <0.2 (*) 0.3 - 1.2 mg/dL   GFR calc non Af Amer >90  >90 mL/min   GFR calc Af Amer >90  >90 mL/min  LACTATE DEHYDROGENASE     Status: None   Collection Time    12/26/13  5:40 AM      Result Value Range   LDH 228  94 - 250 U/L  URIC ACID     Status: None   Collection Time    12/26/13  5:40 AM      Result Value Range   Uric Acid, Serum 6.8  2.4 - 7.0 mg/dL    Assessment / Plan: Induction for severe pre-eclampsia at 35 5/7 weeks S/p foley bulb extrusion at 11:22pm GBS positive--1st dose prophylaxis initiated Magnesium sulfate infusion Mild elevation of AST Mild thrombocytopenia, but platelet count stable Continue Labetalol 200 mg po TID--received 1st dose at 6am.  Plan: Transfer of care to Dr. Simona Huh Continue pitocin induction Patient plans epidural as labor advances.   Georganne Siple, [place] 12/26/2013, 7:06 AM

## 2013-12-26 NOTE — Progress Notes (Signed)
Alice Osborne is a 28 y.o. G2P0010 at [redacted]w[redacted]d  Subjective: Contractions getting stronger, feeling more pressure.  Denies LOF.  Active fetus. Saw black spots this am but none currently.  Had a headache but it spontaneously resolved after bathing.  Objective: BP 150/108  Pulse 79  Temp(Src) 98.5 F (36.9 C) (Oral)  Resp 18  Ht 5\' 3"  (1.6 m)  Wt 97.024 kg (213 lb 14.4 oz)  BMI 37.90 kg/m2  SpO2 99%  LMP 04/21/2013 I/O last 3 completed shifts: In: 1490.3 [P.O.:240; I.V.:1000.3; IV Piggyback:250] Out: 100 [Urine:100] Total I/O In: 625 [P.O.:175; I.V.:450] Out: 850 [Urine:850]  Gen:  NAD.  More discomfort with contractions s/p AROM. CV:  RRR Lungs:  Decreased breath sounds at bases, no crackles or wheezes. Ext 3+ edema but much improved from previous. FHT:  FHR: 130s bpm, variability: moderate,  accelerations:  Present,  decelerations:  Present mild variables UC:   regular, every 2-4 minutes SVE:   Dilation: 5 Effacement (%): 50 Station: -2 Exam by:: Iyani Dresner Pitocin 17 units  Labs: Lab Results  Component Value Date   WBC 7.0 12/26/2013   HGB 11.1* 12/26/2013   HCT 30.9* 12/26/2013   MCV 88.5 12/26/2013   PLT 132* 12/26/2013   No new labs. Assessment / Plan: Severe preeclampsia at 35 47/ weeks.  Labor: Latent labor.  Pt will likely start active phase now s/p AROM. Preeclampsia:  on magnesium sulfate and BP severe currently.  Got Labetalol po.  Will order Hydralazine IV 5 mg prn SBP  greater than 165, DBP greater than 105.  Check magnesium level with Preeclampsia labs. Fetal Wellbeing:  Category I Pain Control:  Epidural or stadol per pt request. I/D:  GBS+.  S/p 2 doses of PCN. Anticipated MOD:  NSVD  Alice Osborne 12/26/2013, 1:42 PM

## 2013-12-27 ENCOUNTER — Encounter (HOSPITAL_COMMUNITY): Payer: Self-pay | Admitting: *Deleted

## 2013-12-27 ENCOUNTER — Telehealth: Payer: Self-pay | Admitting: Surgery

## 2013-12-27 LAB — CBC
HCT: 29.9 % — ABNORMAL LOW (ref 36.0–46.0)
Hemoglobin: 10.7 g/dL — ABNORMAL LOW (ref 12.0–15.0)
MCH: 31.7 pg (ref 26.0–34.0)
MCHC: 35.8 g/dL (ref 30.0–36.0)
MCV: 88.5 fL (ref 78.0–100.0)
Platelets: 132 10*3/uL — ABNORMAL LOW (ref 150–400)
RBC: 3.38 MIL/uL — ABNORMAL LOW (ref 3.87–5.11)
RDW: 13.2 % (ref 11.5–15.5)
WBC: 16.2 10*3/uL — ABNORMAL HIGH (ref 4.0–10.5)

## 2013-12-27 LAB — MRSA PCR SCREENING: MRSA BY PCR: POSITIVE — AB

## 2013-12-27 MED ORDER — BENZOCAINE-MENTHOL 20-0.5 % EX AERO
1.0000 "application " | INHALATION_SPRAY | CUTANEOUS | Status: DC | PRN
  Administered 2013-12-27: 1 via TOPICAL
  Filled 2013-12-27: qty 56

## 2013-12-27 MED ORDER — LACTATED RINGERS IV SOLN
INTRAVENOUS | Status: DC
  Administered 2013-12-27 (×2): via INTRAVENOUS

## 2013-12-27 MED ORDER — MISOPROSTOL 200 MCG PO TABS
1000.0000 ug | ORAL_TABLET | Freq: Once | ORAL | Status: AC
  Administered 2013-12-27: 1000 ug via VAGINAL

## 2013-12-27 MED ORDER — IBUPROFEN 600 MG PO TABS
600.0000 mg | ORAL_TABLET | Freq: Four times a day (QID) | ORAL | Status: DC
  Administered 2013-12-27 – 2013-12-29 (×10): 600 mg via ORAL
  Filled 2013-12-27 (×10): qty 1

## 2013-12-27 MED ORDER — LANOLIN HYDROUS EX OINT: TOPICAL_OINTMENT | CUTANEOUS | Status: DC | PRN

## 2013-12-27 MED ORDER — PRENATAL MULTIVITAMIN CH
1.0000 | ORAL_TABLET | Freq: Every day | ORAL | Status: DC
Start: 2013-12-27 — End: 2013-12-29
  Administered 2013-12-27 – 2013-12-29 (×3): 1 via ORAL
  Filled 2013-12-27 (×3): qty 1

## 2013-12-27 MED ORDER — ONDANSETRON HCL 4 MG/2ML IJ SOLN: 4.0000 mg | INTRAMUSCULAR | Status: DC | PRN

## 2013-12-27 MED ORDER — WITCH HAZEL-GLYCERIN EX PADS: 1.0000 "application " | MEDICATED_PAD | CUTANEOUS | Status: DC | PRN

## 2013-12-27 MED ORDER — DIBUCAINE 1 % RE OINT: 1.0000 "application " | TOPICAL_OINTMENT | RECTAL | Status: DC | PRN

## 2013-12-27 MED ORDER — FERROUS SULFATE 325 (65 FE) MG PO TABS
325.0000 mg | ORAL_TABLET | Freq: Two times a day (BID) | ORAL | Status: DC
  Administered 2013-12-27 – 2013-12-29 (×5): 325 mg via ORAL
  Filled 2013-12-27 (×5): qty 1

## 2013-12-27 MED ORDER — SIMETHICONE 80 MG PO CHEW: 80.0000 mg | CHEWABLE_TABLET | ORAL | Status: DC | PRN

## 2013-12-27 MED ORDER — SENNOSIDES-DOCUSATE SODIUM 8.6-50 MG PO TABS
2.0000 | ORAL_TABLET | ORAL | Status: DC
Start: 2013-12-28 — End: 2013-12-29
  Administered 2013-12-27 – 2013-12-28 (×2): 2 via ORAL
  Filled 2013-12-27 (×2): qty 2

## 2013-12-27 MED ORDER — LABETALOL HCL 100 MG PO TABS
100.0000 mg | ORAL_TABLET | Freq: Two times a day (BID) | ORAL | Status: DC
  Administered 2013-12-27 – 2013-12-29 (×4): 100 mg via ORAL
  Filled 2013-12-27 (×4): qty 1

## 2013-12-27 MED ORDER — ONDANSETRON HCL 4 MG PO TABS: 4.0000 mg | ORAL_TABLET | ORAL | Status: DC | PRN

## 2013-12-27 MED ORDER — MUPIROCIN 2 % EX OINT
1.0000 "application " | TOPICAL_OINTMENT | Freq: Two times a day (BID) | CUTANEOUS | Status: DC
  Administered 2013-12-27 – 2013-12-29 (×5): 1 via NASAL
  Filled 2013-12-27: qty 22

## 2013-12-27 MED ORDER — ZOLPIDEM TARTRATE 5 MG PO TABS: 5.0000 mg | ORAL_TABLET | Freq: Every evening | ORAL | Status: DC | PRN

## 2013-12-27 MED ORDER — DIPHENHYDRAMINE HCL 25 MG PO CAPS: 25.0000 mg | ORAL_CAPSULE | Freq: Four times a day (QID) | ORAL | Status: DC | PRN

## 2013-12-27 MED ORDER — TETANUS-DIPHTH-ACELL PERTUSSIS 5-2.5-18.5 LF-MCG/0.5 IM SUSP
0.5000 mL | Freq: Once | INTRAMUSCULAR | Status: AC
  Administered 2013-12-28: 0.5 mL via INTRAMUSCULAR
  Filled 2013-12-27 (×2): qty 0.5

## 2013-12-27 MED ORDER — OXYCODONE-ACETAMINOPHEN 5-325 MG PO TABS
1.0000 | ORAL_TABLET | ORAL | Status: DC | PRN
  Administered 2013-12-27 – 2013-12-29 (×3): 1 via ORAL
  Filled 2013-12-27 (×3): qty 1

## 2013-12-27 MED ORDER — CHLORHEXIDINE GLUCONATE CLOTH 2 % EX PADS
6.0000 | MEDICATED_PAD | Freq: Every day | CUTANEOUS | Status: DC
  Administered 2013-12-28 – 2013-12-29 (×2): 6 via TOPICAL

## 2013-12-27 MED ORDER — MISOPROSTOL 200 MCG PO TABS
ORAL_TABLET | ORAL | Status: AC
  Filled 2013-12-27: qty 5

## 2013-12-27 NOTE — Progress Notes (Signed)
Delivery call to NICU

## 2013-12-27 NOTE — Progress Notes (Signed)
Post Partum Day 0 Subjective: no complaints  Denies headaches, visual changes, abdominal pain.  Pt states bleeding is heavy but has reassured by RN that it is normal.  Baby doing well.  In nursery to get her temperature up.  Objective: Blood pressure 130/83, pulse 84, temperature 99.2 F (37.3 C), temperature source Oral, resp. rate 18, height 5\' 3"  (1.6 m), weight 92.262 kg (203 lb 6.4 oz), last menstrual period 04/21/2013, SpO2 99.00%, unknown if currently breastfeeding.  Physical Exam:  General: alert and cooperative DVT Evaluation: No evidence of DVT seen on physical exam.  SCDs in place. CV:  RRR Lungs: CTA bilaterally Abdomen:  No RUQ pain. Neuro: DTR 2+   Recent Labs  12/26/13 1635 12/27/13 0520  HGB 11.6* 10.7*  HCT 33.1* 29.9*   Platelets stable 130s No CMP pending  Assessment/Plan: Severe Preclampsia s/p SVD. On Magnesium sulfate for seizure prophylaxis.  Continue until 24 hours post delivery.  UOP normal.  No sxs of magnesium toxicity. BP normal on Labetalol 200 mg TID. LE swelling reduced on bedrest and knee pain resolved s/p delivery. Observe lochia closely.  Cytotec prn. Breast feeding.  Lactation support.   LOS: 4 days   Ronin Crager 12/27/2013, 8:26 AM

## 2013-12-27 NOTE — Progress Notes (Signed)
NICU team at bedside for delivery

## 2013-12-27 NOTE — Lactation Note (Signed)
This note was copied from the chart of Alice Faryal Marxen. Lactation Consultation Note  Initial conault with this mom of a 40 5/7 week gstation baby, now 17 hours post partum. Mom has been breast feeding  The baby. Mom is in AICU on magnesiuim drip. i assisted mom with latching in football hold to right breast. The baby latched easily with wide mouth, but needed botttom lip pullled out. She suckled well for 20 minutes. Mom has easily expresed good amounts of colostrum. The baby has voided and stooled, and overall seems to be doing well. i did teaching with mom from the baby and me book. Mom knows to call for questions/concerns. Lactation services reviewed with mom also.  Patient Name: Alice Osborne HUDJS'H Date: 12/27/2013 Reason for consult: Initial assessment;Infant < 6lbs;Late preterm infant   Maternal Data Formula Feeding for Exclusion: Yes Reason for exclusion: Admission to Intensive Care Unit (ICU) post-partum (mom in aicu) Has patient been taught Hand Expression?: Yes Does the patient have breastfeeding experience prior to this delivery?: No  Feeding Feeding Type: Breast Fed Length of feed: 20 min  LATCH Score/Interventions Latch: Grasps breast easily, tongue down, lips flanged, rhythmical sucking. (bottom lip needed help flanging out) Intervention(s): Adjust position;Assist with latch;Breast massage;Breast compression  Audible Swallowing: A few with stimulation Intervention(s): Hand expression  Type of Nipple: Everted at rest and after stimulation  Comfort (Breast/Nipple): Soft / non-tender     Hold (Positioning): Assistance needed to correctly position infant at breast and maintain latch. Intervention(s): Breastfeeding basics reviewed;Support Pillows;Position options;Skin to skin  LATCH Score: 8  Lactation Tools Discussed/Used     Consult Status Consult Status: Follow-up Date: 12/28/13 Follow-up type: In-patient    [name] 12/27/2013, 6:59  PM

## 2013-12-27 NOTE — Telephone Encounter (Addendum)
Message copied by Gena Fray on Tue Dec 27, 2013 11:02 AM ------      Message from: Peter Minium K      Created: Mon Dec 26, 2013  6:47 PM      Regarding: SCHEDULE                   ----- Message -----         From: Sherrye Payor, RN         Sent: 12/26/2013   1:45 PM           To: Mena Goes, CMA                        ----- Message -----         From: Serafina Mitchell, MD         Sent: 12/25/2013   9:42 AM           To: Patrici Ranks, Alfonso Patten, RN, #            1-10-015:            Level for consult for right leg swelling.  Please schedule the patient to see me in the office in 6 weeks with a bilateral lower extremity venous insufficiency ultrasound ------  12/27/13: lm for pt re appt, dpm

## 2013-12-27 NOTE — Progress Notes (Signed)
Pt breast feeding currently.  BP normal.115/73  High 132/97 UOP 35/75/75 Fundus firm NT Ext 3++ edema UOP improving. BP significantly lower than previous. Decrease Labetalol to 100 mg BID instead of 200 mg TID. Consider IV Lasix due to LE swelling. Discontinue Magnesium 24 hour delivery. Discontinue Foley at that time. Dr. Landry Mellow on overnight.  Will update her.

## 2013-12-27 NOTE — Anesthesia Postprocedure Evaluation (Signed)
  Anesthesia Post-op Note  Patient: Alice Osborne  Procedure(s) Performed: * No procedures listed *  Patient Location: ICU  Anesthesia Type:Epidural  Level of Consciousness: awake, alert , oriented and patient cooperative  Airway and Oxygen Therapy: Patient Spontanous Breathing  Post-op Pain: mild  Post-op Assessment: Patient's Cardiovascular Status Stable, Respiratory Function Stable, No headache, No backache, No residual numbness and No residual motor weakness Remains on MgSO4  Post-op Vital Signs: stable  Complications: No apparent anesthesia complications

## 2013-12-27 NOTE — Progress Notes (Signed)
Delivery imminent, FHR tracing 90 beats per minute for 7 minutes . Fetal head crowning. Linda Hedges, CNM and Dr. Kennon Rounds at bedside

## 2013-12-28 LAB — COMPREHENSIVE METABOLIC PANEL
ALT: 24 U/L (ref 0–35)
AST: 36 U/L (ref 0–37)
Albumin: 2 g/dL — ABNORMAL LOW (ref 3.5–5.2)
Alkaline Phosphatase: 94 U/L (ref 39–117)
BUN: 19 mg/dL (ref 6–23)
CO2: 22 meq/L (ref 19–32)
Calcium: 7.4 mg/dL — ABNORMAL LOW (ref 8.4–10.5)
Chloride: 101 mEq/L (ref 96–112)
Creatinine, Ser: 0.58 mg/dL (ref 0.50–1.10)
GFR calc Af Amer: >90 mL/min (ref >90)
GLUCOSE: 83 mg/dL (ref 70–99)
Potassium: 4.4 mEq/L (ref 3.7–5.3)
Sodium: 134 mEq/L — ABNORMAL LOW (ref 137–147)
Total Protein: 5.4 g/dL — ABNORMAL LOW (ref 6.0–8.3)

## 2013-12-28 LAB — CBC
HEMATOCRIT: 26 % — AB (ref 36.0–46.0)
Hemoglobin: 9.1 g/dL — ABNORMAL LOW (ref 12.0–15.0)
MCH: 31.4 pg (ref 26.0–34.0)
MCHC: 35 g/dL (ref 30.0–36.0)
MCV: 89.7 fL (ref 78.0–100.0)
Platelets: 141 10*3/uL — ABNORMAL LOW (ref 150–400)
RBC: 2.9 MIL/uL — AB (ref 3.87–5.11)
RDW: 13.7 % (ref 11.5–15.5)
WBC: 10 10*3/uL (ref 4.0–10.5)

## 2013-12-28 MED ORDER — FUROSEMIDE 20 MG PO TABS
10.0000 mg | ORAL_TABLET | Freq: Two times a day (BID) | ORAL | Status: DC
  Administered 2013-12-28: 10 mg via ORAL
  Filled 2013-12-28 (×2): qty 0.5

## 2013-12-28 MED ORDER — FUROSEMIDE 20 MG PO TABS
20.0000 mg | ORAL_TABLET | Freq: Two times a day (BID) | ORAL | Status: DC
  Administered 2013-12-28: 20 mg via ORAL
  Filled 2013-12-28 (×2): qty 1

## 2013-12-28 MED ORDER — RHO D IMMUNE GLOBULIN 1500 UNIT/2ML IJ SOLN
300.0000 ug | Freq: Once | INTRAMUSCULAR | Status: AC
  Administered 2013-12-28: 300 ug via INTRAMUSCULAR
  Filled 2013-12-28: qty 2

## 2013-12-28 NOTE — Progress Notes (Addendum)
Post Partum Day 1 Subjective: no complaints.  Denies headaches, visual changes.  Breast feeding well.  Lochia unchanged.  Objective: Blood pressure 136/77, pulse 85, temperature 99.4 F (37.4 C), temperature source Oral, resp. rate 18, height 5\' 3"  (1.6 m), weight 92.262 kg (203 lb 6.4 oz), last menstrual period 04/21/2013, SpO2 99.00%, unknown if currently breastfeeding.  Physical Exam:  General: alert, cooperative and no distress Lochia: appropriate Uterine Fundus: firm Incision: n/a DVT Evaluation: No evidence of DVT seen on physical exam. Calf/Ankle edema is present.   Recent Labs  12/26/13 1635 12/27/13 0520  HGB 11.6* 10.7*  HCT 33.1* 29.9*    Assessment/Plan: Severe Preeclampsia s/p SVD, Magnesium sulfate x 24 hours. Diuresis started overnight. Lower extremity still significantly swollen.  Administer Lasix 10 mg BID. Strict I/Os.  Weigh pt prior to diuretic. BP slightly elevated overnight but overall well controlled.  Continue Labetalol 100 mg BID. Routine pp care. Lactation support.  Pt informed of possible decreased milk supply with diuretics, but informed this is temporary. Rh +.   S/p Rhogam.   LOS: 5 days   Ashyia Schraeder 12/28/2013, 10:18 AM

## 2013-12-28 NOTE — Lactation Note (Signed)
This note was copied from the chart of Girl Enyah Moman. Lactation Consultation Note     Follow up consult with this mom and baby, now 32 hours post partum, and 35 6/7 weeks corrected gestation. The baby is at 5 % weight loss, but is voiding and stooling well. I spoke to mom about possibly starting a DEP today, to provide EBM as supplmetn for the baby, and to protect mom's milk supply cluster feeding and possible engorgement reviewed.   Mom was transferred from [place] this morning to [place], and I spoke to [facility], and informed her of the above.   Patient Name: Girl Siham Bucaro ZOXWR'U Date: 12/28/2013 Reason for consult: Follow-up assessment;Infant < 6lbs   Maternal Data    Feeding    LATCH Score/Interventions                      Lactation Tools Discussed/Used     Consult Status Consult Status: Follow-up Date: 12/29/13 Follow-up type: In-patient    [name] 12/28/2013, 12:59 PM

## 2013-12-28 NOTE — Progress Notes (Signed)
Report given via telephone to Noemi Chapel, RN on Mother Alice Osborne.

## 2013-12-29 LAB — RH IG WORKUP (INCLUDES ABO/RH)
ABO/RH(D): A NEG
Fetal Screen: NEGATIVE
Gestational Age(Wks): 35.5
Unit division: 0

## 2013-12-29 MED ORDER — FUROSEMIDE 40 MG PO TABS
40.0000 mg | ORAL_TABLET | Freq: Two times a day (BID) | ORAL | Status: DC
Start: 2013-12-29 — End: 2013-12-29
  Administered 2013-12-29: 40 mg via ORAL
  Filled 2013-12-29 (×3): qty 1

## 2013-12-29 MED ORDER — LABETALOL HCL 100 MG PO TABS: 100.0000 mg | ORAL_TABLET | Freq: Three times a day (TID) | ORAL | Status: DC

## 2013-12-29 MED ORDER — LABETALOL HCL 100 MG PO TABS
100.0000 mg | ORAL_TABLET | Freq: Three times a day (TID) | ORAL | Status: DC
  Filled 2013-12-29 (×3): qty 1

## 2013-12-29 MED ORDER — IBUPROFEN 600 MG PO TABS: 600.0000 mg | ORAL_TABLET | Freq: Four times a day (QID) | ORAL | Status: DC

## 2013-12-29 NOTE — Lactation Note (Signed)
This note was copied from the chart of Alice Jamil Castillo. Lactation Consultation Note Follow up consult:  Baby Alice 24 hours old.  LS 10.  Baby latches well with flanged lips, intermittently sucking.  Encouraged mother to postpump 4x a day and give it to the baby in a slow flow bottle.  Parents said baby was up all night feeding and they are exhausted and possibly going home today.  Reviewed cluster feeding and the value of the breastmilk she is giving the baby.  Encouraged mother to rely on support for caring for the baby so she can sleep between feedings.   Encouraged mother to call for further assistance.   Patient Name: Alice Osborne [id number] Date: 12/29/2013 Reason for consult: Follow-up assessment   Maternal Data    Feeding Feeding Type: Breast Fed Length of feed: 25 min  LATCH Score/Interventions Latch: Grasps breast easily, tongue down, lips flanged, rhythmical sucking.  Audible Swallowing: Spontaneous and intermittent  Type of Nipple: Everted at rest and after stimulation  Comfort (Breast/Nipple): Soft / non-tender     Hold (Positioning): No assistance needed to correctly position infant at breast. Intervention(s): Breastfeeding basics reviewed  LATCH Score: 10  Lactation Tools Discussed/Used Tools: Pump   Consult Status Consult Status: Complete    Carlye Grippe 12/29/2013, 8:56 AM

## 2013-12-29 NOTE — Discharge Instructions (Signed)
Postpartum Care After Vaginal Delivery After you deliver your newborn (postpartum period), the usual stay in the hospital is 24 72 hours. If there were problems with your labor or delivery, or if you have other medical problems, you might be in the hospital longer.  While you are in the hospital, you will receive help and instructions on how to care for yourself and your newborn during the postpartum period.  While you are in the hospital:  Be sure to tell your nurses if you have pain or discomfort, as well as where you feel the pain and what makes the pain worse.  If you had an incision made near your vagina (episiotomy) or if you had some tearing during delivery, the nurses may put ice packs on your episiotomy or tear. The ice packs may help to reduce the pain and swelling.  If you are breastfeeding, you may feel uncomfortable contractions of your uterus for a couple of weeks. This is normal. The contractions help your uterus get back to normal size.  It is normal to have some bleeding after delivery.  For the first 1 3 days after delivery, the flow is red and the amount may be similar to a period.  It is common for the flow to start and stop.  In the first few days, you may pass some small clots. Let your nurses know if you begin to pass large clots or your flow increases.  Do not  flush blood clots down the toilet before having the nurse look at them.  During the next 3 10 days after delivery, your flow should become more watery and pink or brown-tinged in color.  Ten to fourteen days after delivery, your flow should be a small amount of yellowish-white discharge.  The amount of your flow will decrease over the first few weeks after delivery. Your flow may stop in 6 8 weeks. Most women have had their flow stop by 12 weeks after delivery.  You should change your sanitary pads frequently.  Wash your hands thoroughly with soap and water for at least 20 seconds after changing pads, using  the toilet, or before holding or feeding your newborn.  You should feel like you need to empty your bladder within the first 6 8 hours after delivery.  In case you become weak, lightheaded, or faint, call your nurse before you get out of bed for the first time and before you take a shower for the first time.  Within the first few days after delivery, your breasts may begin to feel tender and full. This is called engorgement. Breast tenderness usually goes away within 48 72 hours after engorgement occurs. You may also notice milk leaking from your breasts. If you are not breastfeeding, do not stimulate your breasts. Breast stimulation can make your breasts produce more milk.  Spending as much time as possible with your newborn is very important. During this time, you and your newborn can feel close and get to know each other. Having your newborn stay in your room (rooming in) will help to strengthen the bond with your newborn. It will give you time to get to know your newborn and become comfortable caring for your newborn.  Your hormones change after delivery. Sometimes the hormone changes can temporarily cause you to feel sad or tearful. These feelings should not last more than a few days. If these feelings last longer than that, you should talk to your caregiver.  If desired, talk to your caregiver about  methods of family planning or contraception.  Talk to your caregiver about immunizations. Your caregiver may want you to have the following immunizations before leaving the hospital:  Tetanus, diphtheria, and pertussis (Tdap) or tetanus and diphtheria (Td) immunization. It is very important that you and your family (including grandparents) or others caring for your newborn are up-to-date with the Tdap or Td immunizations. The Tdap or Td immunization can help protect your newborn from getting ill.  Rubella immunization.  Varicella (chickenpox) immunization.  Influenza immunization. You should  receive this annual immunization if you did not receive the immunization during your pregnancy. Document Released: 09/28/2007 Document Revised: 08/25/2012 Document Reviewed: 07/28/2012 [facility] Patient Information 2014 [place].   Preeclampsia and Eclampsia Preeclampsia is a condition of high blood pressure during pregnancy. It can happen at 20 weeks or later in pregnancy. If high blood pressure occurs in the second half of pregnancy with no other symptoms, it is called gestational hypertension and goes away after the baby is born. If any of the symptoms listed below develop with gestational hypertension, it is then called preeclampsia. Eclampsia (convulsions) may follow preeclampsia. This is one of the reasons for regular prenatal checkups. Early diagnosis and treatment are very important to prevent eclampsia. CAUSES  There is no known cause of preeclampsia/eclampsia in pregnancy. There are several known conditions that may put the pregnant woman at risk, such as:  The first pregnancy.  Having preeclampsia in a past pregnancy.  Having lasting (chronic) high blood pressure.  Having multiples (twins, triplets).  Being age 6 or older.  African American ethnic background.  Having kidney disease or diabetes.  Medical conditions such as lupus or blood diseases.  Being overweight (obese). SYMPTOMS   High blood pressure.  Headaches.  Sudden weight gain.  Swelling of hands, face, legs, and feet.  Protein in the urine.  Feeling sick to your stomach (nauseous) and throwing up (vomiting).  Vision problems (blurred or double vision).  Numbness in the face, arms, legs, and feet.  Dizziness.  Slurred speech.  Preeclampsia can cause growth retardation in the fetus.  Separation (abruption) of the placenta.  Not enough fluid in the amniotic sac (oligohydramnios).  Sensitivity to bright lights.  Belly (abdominal) pain. DIAGNOSIS  If protein is found in the urine in  the second half of pregnancy, this is considered preeclampsia. Other symptoms mentioned above may also be present. TREATMENT  It is necessary to treat this.  Your caregiver may prescribe bed rest early in this condition. Plenty of rest and salt restriction may be all that is needed.  Medicines may be necessary to lower blood pressure if the condition does not respond to more conservative measures.  In more severe cases, hospitalization may be needed:  For treatment of blood pressure.  To control fluid retention.  To monitor the baby to see if the condition is causing harm to the baby.  Hospitalization is the best way to treat the first sign of preeclampsia. This is so the mother and baby can be watched closely and blood tests can be done effectively and correctly.  If the condition becomes severe, it may be necessary to induce labor or to remove the infant by surgical means (cesarean section). The best cure for preeclampsia/eclampsia is to deliver the baby. Preeclampsia and eclampsia involve risks to mother and infant. Your caregiver will discuss these risks with you. Together, you can work out the best possible approach to your problems. Make sure you keep your prenatal visits as scheduled.  Not keeping appointments could result in a chronic or permanent injury, pain, disability to you, and death or injury to you or your unborn baby. If there is any problem keeping the appointment, you must call to reschedule. HOME CARE INSTRUCTIONS   Keep your prenatal appointments and tests as scheduled.  Tell your caregiver if you have any of the above risk factors.  Get plenty of rest and sleep.  Eat a balanced diet that is low in salt, and do not add salt to your food.  Avoid stressful situations.  Only take over-the-counter and prescriptions medicines for pain, discomfort, or fever as directed by your caregiver. SEEK IMMEDIATE MEDICAL CARE IF:   You develop severe swelling anywhere in the  body. This usually occurs in the legs.  You gain 05 lb/2.3 kg or more in a week.  You develop a severe headache, dizziness, problems with your vision, or confusion.  You have abdominal pain, nausea, or vomiting.  You have a seizure.  You have trouble moving any part of your body, or you develop numbness or problems speaking.  You have bruising or abnormal bleeding from anywhere in the body.  You develop a stiff neck.  You pass out. MAKE SURE YOU:   Understand these instructions.  Will watch your condition.  Will get help right away if you are not doing well or get worse. Document Released: 11/28/2000 Document Revised: 02/23/2012 Document Reviewed: 07/14/2008 [facility] Patient Information 2014 [place].

## 2013-12-29 NOTE — Discharge Summary (Signed)
Obstetric Discharge Summary Reason for Admission: Preeclampsia Prenatal Procedures: Preeclampsia and ultrasound Intrapartum Procedures: spontaneous vaginal delivery Postpartum Procedures: Magnesium sulfate, Lasix po Complications-Operative and Postpartum: 1st degree perineal laceration extended to left labia Hemoglobin  Date Value Range Status  12/28/2013 9.1* 12.0 - 15.0 g/dL Final     HCT  Date Value Range Status  12/28/2013 26.0* 36.0 - 46.0 % Final    Physical Exam:  General: alert, cooperative and no distress Lochia: appropriate Uterine Fundus: firm Incision: n/a DVT Evaluation: No evidence of DVT seen on physical exam.  Right calf tenderness has resolved. Calf/Ankle edema is present.  Discharge Diagnoses: Severe Preeclampsia, Preterm delivery  Discharge Information: Date: 12/29/2013 Activity: pelvic rest Diet: routine Medications: PNV and Ibuprofen Condition: improved Instructions: See discharge instructions Discharge to: home Follow-up Information   Follow up with [name], MD. Schedule an appointment as soon as possible for a visit on 01/03/2014. (BP check)    Specialty:  Obstetrics and Gynecology   Contact information:   [address], [address] [place] [address] [phone]       Newborn Data: Live born female  Birth Weight: 5 lb 11.4 oz (2591 g) APGAR: 7, 8  Home with mother.  [name] 12/29/2013, 12:17 PM

## 2013-12-30 LAB — TYPE AND SCREEN
ABO/RH(D): A NEG
ANTIBODY SCREEN: POSITIVE
DAT, IgG: NEGATIVE
Unit division: 0
Unit division: 0

## 2014-02-06 ENCOUNTER — Encounter (HOSPITAL_COMMUNITY): Payer: BC Managed Care – PPO

## 2014-02-06 ENCOUNTER — Ambulatory Visit: Payer: BC Managed Care – PPO | Admitting: Surgery

## 2014-10-16 ENCOUNTER — Encounter (HOSPITAL_COMMUNITY): Payer: Self-pay | Admitting: *Deleted

## 2014-11-22 ENCOUNTER — Other Ambulatory Visit: Payer: Self-pay | Admitting: *Deleted

## 2014-11-22 DIAGNOSIS — I872 Venous insufficiency (chronic) (peripheral): Secondary | ICD-10-CM

## 2014-12-01 ENCOUNTER — Encounter: Payer: Self-pay | Admitting: Surgery

## 2014-12-04 ENCOUNTER — Encounter: Payer: Self-pay | Admitting: Surgery

## 2014-12-04 ENCOUNTER — Ambulatory Visit (INDEPENDENT_AMBULATORY_CARE_PROVIDER_SITE_OTHER): Payer: BC Managed Care – PPO | Admitting: Surgery

## 2014-12-04 ENCOUNTER — Ambulatory Visit (HOSPITAL_COMMUNITY)
Admission: RE | Admit: 2014-12-04 | Discharge: 2014-12-04 | Disposition: A | Discharge status: Home / Self Care | Payer: BC Managed Care – PPO | Source: Ambulatory Visit | Attending: Surgery | Admitting: Surgery

## 2014-12-04 VITALS — BP 137/87 | HR 87 | Ht 63.0 in | Wt 158.1 lb

## 2014-12-04 DIAGNOSIS — I872 Venous insufficiency (chronic) (peripheral): Secondary | ICD-10-CM

## 2014-12-04 DIAGNOSIS — R600 Localized edema: Secondary | ICD-10-CM | POA: Insufficient documentation

## 2014-12-04 NOTE — Progress Notes (Signed)
Patient name: Alice Osborne MRN: [id number] DOB: 08/20/1986 Sex: female     Chief Complaint  Patient presents with  . New Evaluation    c/o bilateral LE swelling R>L     HISTORY OF PRESENT ILLNESS: This is a 28 year old female that I saw while she was in the hospital with right leg pain and swelling.  She was admitted for preeclampsia.  Studies have been negative for DVT.  An ultrasound in the hospital identified varicose veins.  The patient continues to have swelling in both legs, right greater than left.  With prolonged standing the swelling gets worse.  Her legs tend to get very painful with the swelling.  She has worn knee-high compression stockings in the past.  She denies having any history of ulcers.  Past Medical History  Diagnosis Date  . Pregnancy induced hypertension   . GERD (gastroesophageal reflux disease)   . Fibroid   . Anemia     Past Surgical History  Procedure Laterality Date  . No past surgeries      History   Social History  . Marital Status: Married    Spouse Name: N/A    Number of Children: N/A  . Years of Education: N/A   Occupational History  . Not on file.   Social History Main Topics  . Smoking status: Never Smoker   . Smokeless tobacco: Never Used  . Alcohol Use: No  . Drug Use: No  . Sexual Activity: Yes    Birth Control/ Protection: None   Other Topics Concern  . Not on file   Social History Narrative    Family History  Problem Relation Age of Onset  . Hyperlipidemia Mother   . Hypertension Mother   . Varicose Veins Mother     Allergies as of 12/04/2014  . (No Known Allergies)    Current Outpatient Prescriptions on File Prior to Visit  Medication Sig Dispense Refill  . acetaminophen (TYLENOL) 500 MG tablet Take 500 mg by mouth every 6 (six) hours as needed for pain.    Marland Kitchen docusate sodium (COLACE) 100 MG capsule Take 100 mg by mouth daily.    . Emollient (FRUIT OF THE EARTH/ALOE VERA) LOTN Apply 1 application topically  2 (two) times daily.    Marland Kitchen ibuprofen (ADVIL,MOTRIN) 600 MG tablet Take 1 tablet (600 mg total) by mouth every 6 (six) hours. (Patient not taking: Reported on 12/04/2014) 30 tablet 0  . IRON PO Take 1 tablet by mouth every evening.    . labetalol (NORMODYNE) 100 MG tablet Take 1 tablet (100 mg total) by mouth 3 (three) times daily. (Patient not taking: Reported on 12/04/2014) 90 tablet 1  . Prenatal Vit-Fe Fumarate-FA (PRENATAL MULTIVITAMIN) TABS Take 1 tablet by mouth daily at 12 noon.    . promethazine (PHENERGAN) 25 MG tablet Take 1 tablet (25 mg total) by mouth every 6 (six) hours as needed for nausea. (Patient not taking: Reported on 12/04/2014) 30 tablet 0   No current facility-administered medications on file prior to visit.     REVIEW OF SYSTEMS: Cardiovascular: No chest pain, chest pressure, palpitations, orthopnea, or dyspnea on exertion. No claudication or rest pain,  No history of DVT or phlebitis.  Positive swelling Pulmonary: No productive cough, asthma or wheezing. Neurologic: No weakness, paresthesias, aphasia, or amaurosis.  Positive dizziness. Hematologic: No bleeding problems or clotting disorders. Musculoskeletal: No joint pain or joint swelling. Gastrointestinal: No blood in stool or hematemesis Genitourinary: No dysuria or hematuria. Psychiatric::  No history of major depression. Integumentary: No rashes or ulcers. Constitutional: No fever or chills.  PHYSICAL EXAMINATION:   Vital signs are BP 137/87 mmHg  Pulse 87  Ht 5\' 3"  (1.6 m)  Wt 158 lb 1.6 oz (71.714 kg)  BMI 28.01 kg/m2  SpO2 100% General: The patient appears their stated age. HEENT:  No gross abnormalities Pulmonary:  Non labored breathing Musculoskeletal: There are no major deformities. Neurologic: No focal weakness or paresthesias are detected, Skin: There are no ulcer or rashes noted. Psychiatric: The patient has normal affect. Cardiovascular: There is a regular rate and rhythm without significant  murmur appreciated.  Pitting edema right leg   Diagnostic Studies Ultrasound was ordered and reviewed today.  The patient has evidence of bilateral deep vein reflux.  There is no evidence of deep vein thrombosis.  She has reflux within the right great saphenous vein with maximum diameter of 0.64 cm.  Assessment: Bilateral venous insufficiency, right greater than left Plan: I am placing the patient and 20-30 thigh-high compression stockings today to see how this helps her symptoms of pain and swelling.  Based on her ultrasound studies, she would be a good candidate for endovenous laser ablation of the right great saphenous vein.  The patient will be brought back in 3 months for further discussions and follow-up.  I did tell her that she has reflux in both deep system so she may not be 100% corrected with ablation of the superficial venous system.  Eldridge Abrahams, M.D. [facility] Office: [phone] Pager:  [phone]

## 2015-01-12 ENCOUNTER — Other Ambulatory Visit: Payer: Self-pay

## 2015-03-05 ENCOUNTER — Encounter: Payer: Self-pay | Admitting: Vascular Surgery

## 2015-03-06 ENCOUNTER — Ambulatory Visit (INDEPENDENT_AMBULATORY_CARE_PROVIDER_SITE_OTHER): Payer: BLUE CROSS/BLUE SHIELD | Admitting: Vascular Surgery

## 2015-03-06 ENCOUNTER — Encounter: Payer: Self-pay | Admitting: Vascular Surgery

## 2015-03-06 VITALS — BP 138/90 | HR 81 | Resp 16 | Ht 64.0 in | Wt 158.0 lb

## 2015-03-06 DIAGNOSIS — M7989 Other specified soft tissue disorders: Secondary | ICD-10-CM

## 2015-03-06 NOTE — Progress Notes (Signed)
Subjective:     Patient ID: Alice Osborne, female   DOB: [birth year], 29 y.o.   MRN: [id number]  HPI this 29 year old female returns for further discussion about her edema in the right lower extremity worse than the left. This has all occurred since her pregnancy which is 15 months ago. She has been wearing a long-leg elastic compression stockings with some improvement. She has not been elevating her legs at night. She does have a tightness in her right thigh when she tries to stretch at night. The left leg edema has resolved significantly. She has no history of DVT.  Past Medical History  Diagnosis Date  . Pregnancy induced hypertension   . GERD (gastroesophageal reflux disease)   . Fibroid   . Anemia     History  Substance Use Topics  . Smoking status: Never Smoker   . Smokeless tobacco: Never Used  . Alcohol Use: No    Family History  Problem Relation Age of Onset  . Hyperlipidemia Mother   . Hypertension Mother   . Varicose Veins Mother     No Known Allergies   Current outpatient prescriptions:  .  acetaminophen (TYLENOL) 500 MG tablet, Take 500 mg by mouth every 6 (six) hours as needed for pain., Disp: , Rfl:  .  docusate sodium (COLACE) 100 MG capsule, Take 100 mg by mouth daily., Disp: , Rfl:  .  Emollient (FRUIT OF THE EARTH/ALOE VERA) LOTN, Apply 1 application topically 2 (two) times daily., Disp: , Rfl:  .  ERRIN 0.35 MG tablet, Take 1 tablet by mouth daily., Disp: , Rfl:  .  ibuprofen (ADVIL,MOTRIN) 600 MG tablet, Take 1 tablet (600 mg total) by mouth every 6 (six) hours. (Patient not taking: Reported on 12/04/2014), Disp: 30 tablet, Rfl: 0 .  IRON PO, Take 1 tablet by mouth every evening., Disp: , Rfl:  .  labetalol (NORMODYNE) 100 MG tablet, Take 1 tablet (100 mg total) by mouth 3 (three) times daily. (Patient not taking: Reported on 12/04/2014), Disp: 90 tablet, Rfl: 1 .  Prenatal Vit-Fe Fumarate-FA (PRENATAL MULTIVITAMIN) TABS, Take 1 tablet by mouth daily at 12  noon., Disp: , Rfl:  .  promethazine (PHENERGAN) 25 MG tablet, Take 1 tablet (25 mg total) by mouth every 6 (six) hours as needed for nausea. (Patient not taking: Reported on 12/04/2014), Disp: 30 tablet, Rfl: 0  Filed Vitals:   03/06/15 1602  BP: 138/90  Pulse: 81  Resp: 16  Height: 5\' 4"  (1.626 m)  Weight: 158 lb (71.668 kg)    Body mass index is 27.11 kg/(m^2).         Review of Systems denies chest pain, dyspnea on exertion, PND, orthopnea. Does have gastroesophageal reflux disease.     Objective:   Physical Exam BP 138/90 mmHg  Pulse 81  Resp 16  Ht 5\' 4"  (1.626 m)  Wt 158 lb (71.668 kg)  BMI 27.11 kg/m2  Gen. well-developed well-nourished female no apparent stress alert and oriented 3 Lungs no rhonchi or wheezing Right leg with 1+ edema from distal thigh to ankle 3+ dorsalis pedis pulse palpable  Today I independently image the right leg with the sono site ultrasound and visualize the great saphenous vein. There is some reflux in the great saphenous vein but it is a small caliber vein except near the junction. There is no DVT on previous duplex study but there is some deep vein reflux.     Assessment:     Post partum edema right  leg with no evidence of severe reflux in right great saphenous vein and no evidence of DVT Deep vein reflux is present right leg    Plan:     #1 discussed with patient importance of elevation of foot of bed or mattress at night 2-3 inches #2 apply a long-leg elastic compression stockings first thing in a.m. #3 elevate legs periodically during day  Will try this for the next 6 months to see if this improves the edema. She is not a candidate present time for laser ablation as this will not improve her symptoms return on a when necessary basis

## 2016-03-19 ENCOUNTER — Other Ambulatory Visit (HOSPITAL_COMMUNITY)
Admission: RE | Admit: 2016-03-19 | Discharge: 2016-03-19 | Disposition: A | Discharge status: Home / Self Care | Payer: BLUE CROSS/BLUE SHIELD | Source: Ambulatory Visit | Attending: Obstetrics and Gynecology | Admitting: Obstetrics and Gynecology

## 2016-03-19 ENCOUNTER — Other Ambulatory Visit: Payer: Self-pay | Admitting: Obstetrics and Gynecology

## 2016-03-19 DIAGNOSIS — Z01419 Encounter for gynecological examination (general) (routine) without abnormal findings: Secondary | ICD-10-CM | POA: Diagnosis present

## 2016-03-19 DIAGNOSIS — Z1151 Encounter for screening for human papillomavirus (HPV): Secondary | ICD-10-CM | POA: Insufficient documentation

## 2016-03-21 LAB — CYTOLOGY - PAP

## 2016-07-27 ENCOUNTER — Encounter (HOSPITAL_COMMUNITY): Payer: Self-pay | Admitting: *Deleted

## 2016-07-27 ENCOUNTER — Inpatient Hospital Stay (HOSPITAL_COMMUNITY)
Admission: AD | Admit: 2016-07-27 | Discharge: 2016-07-27 | Disposition: A | Discharge status: Home / Self Care | Payer: BLUE CROSS/BLUE SHIELD | Source: Ambulatory Visit | Attending: Obstetrics and Gynecology | Admitting: Obstetrics and Gynecology

## 2016-07-27 DIAGNOSIS — G44029 Chronic cluster headache, not intractable: Secondary | ICD-10-CM | POA: Diagnosis not present

## 2016-07-27 DIAGNOSIS — O26891 Other specified pregnancy related conditions, first trimester: Secondary | ICD-10-CM | POA: Insufficient documentation

## 2016-07-27 DIAGNOSIS — R110 Nausea: Secondary | ICD-10-CM | POA: Insufficient documentation

## 2016-07-27 DIAGNOSIS — Z3A01 Less than 8 weeks gestation of pregnancy: Secondary | ICD-10-CM | POA: Insufficient documentation

## 2016-07-27 DIAGNOSIS — Z3490 Encounter for supervision of normal pregnancy, unspecified, unspecified trimester: Secondary | ICD-10-CM

## 2016-07-27 DIAGNOSIS — O9989 Other specified diseases and conditions complicating pregnancy, childbirth and the puerperium: Secondary | ICD-10-CM

## 2016-07-27 DIAGNOSIS — R51 Headache: Secondary | ICD-10-CM | POA: Diagnosis present

## 2016-07-27 LAB — URINALYSIS, ROUTINE W REFLEX MICROSCOPIC
Bilirubin Urine: NEGATIVE
Glucose, UA: NEGATIVE mg/dL
Hgb urine dipstick: NEGATIVE
KETONES UR: NEGATIVE mg/dL
LEUKOCYTES UA: NEGATIVE
NITRITE: NEGATIVE
PROTEIN: NEGATIVE mg/dL
Specific Gravity, Urine: 1.025 (ref 1.005–1.030)
pH: 6 (ref 5.0–8.0)

## 2016-07-27 LAB — POCT PREGNANCY, URINE: Preg Test, Ur: POSITIVE — AB

## 2016-07-27 MED ORDER — BUTALBITAL-APAP-CAFFEINE 50-325-40 MG PO TABS
1.0000 | ORAL_TABLET | Freq: Once | ORAL | Status: AC
Start: 2016-07-27 — End: 2016-07-27
  Administered 2016-07-27: 1 via ORAL
  Filled 2016-07-27: qty 1

## 2016-07-27 MED ORDER — PROMETHAZINE HCL 25 MG PO TABS
25.0000 mg | ORAL_TABLET | Freq: Once | ORAL | Status: AC
  Administered 2016-07-27: 25 mg via ORAL
  Filled 2016-07-27: qty 1

## 2016-07-27 MED ORDER — BUTALBITAL-APAP-CAFFEINE 50-325-40 MG PO TABS: 1.0000 | ORAL_TABLET | Freq: Four times a day (QID) | ORAL | 0 refills | Status: DC | PRN

## 2016-07-27 MED ORDER — PROMETHAZINE HCL 12.5 MG PO TABS: 12.5000 mg | ORAL_TABLET | Freq: Four times a day (QID) | ORAL | 6 refills | Status: DC | PRN

## 2016-07-27 NOTE — MAU Note (Signed)
3 +HPT. Has appt tomorrow. Came because of the headache

## 2016-07-27 NOTE — Discharge Instructions (Signed)
Cluster Headache  Cluster headaches are recognized by their pattern of deep, intense head pain. They normally occur on one side of your head, but they may "switch sides" in subsequent episodes. Typically, cluster headaches:   · Are severe in nature.    · Occur repeatedly over weeks to months and are followed by periods of no headaches.    · Can last from 15 minutes to 3 hours.    · Occur at the same time each day, often at night.    · Occur several times a day.  CAUSES  The exact cause of cluster headaches is not known. Alcohol use may be associated with cluster headaches.  SIGNS AND SYMPTOMS   · Severe pain that begins in or around your eye or temple.    · One-sided head pain.    · Feeling sick to your stomach (nauseous).    · Sensitivity to light.    · Runny nose.    · Eye redness, tearing, and nasal stuffiness on the side of your head where you are experiencing pain.    · Sweaty, pale skin of the face.    · Droopy or swollen eyelid.    · Restlessness.  DIAGNOSIS   Cluster headaches are diagnosed based on symptoms and a physical exam. Your health care provider may order a CT scan or an MRI of your head or lab tests to see if your headaches are caused by other medical conditions.   TREATMENT   · Medicines for pain relief and to prevent recurrent attacks. Some people may need a combination of medicines.  · Oxygen for pain relief.    · Biofeedback programs to help reduce headache pain.    It may be helpful to keep a headache diary. This may help you find a trend for what is triggering your headaches. Your health care provider can develop a treatment plan.   HOME CARE INSTRUCTIONS   During cluster periods:   · Follow a regular sleep schedule. Do not vary the amount and time that you sleep from day to day. It is important to stay on the same schedule during a cluster period to help prevent headaches.    · Avoid alcohol.    · Stop smoking if you smoke.    SEEK MEDICAL CARE IF:  · You have any changes from your previous  cluster headaches either in intensity or frequency.    · You are not getting relief from medicines you are taking.    SEEK IMMEDIATE MEDICAL CARE IF:   · You faint.    · You have weakness or numbness, especially on one side of your body or face.    · You have double vision.    · You have nausea or vomiting that is not relieved within several hours.    · You cannot keep your balance or have difficulty talking or walking.    · You have neck pain or stiffness.    · You have a fever.  MAKE SURE YOU:  · Understand these instructions.    · Will watch your condition.    · Will get help right away if you are not doing well or get worse.     This information is not intended to replace advice given to you by your health care provider. Make sure you discuss any questions you have with your health care provider.     Document Released: 12/01/2005 Document Revised: 09/21/2013 Document Reviewed: 06/23/2013  Elsevier Interactive Patient Education ©2016 [employer]

## 2016-07-27 NOTE — MAU Provider Note (Signed)
History   [id number] early preg in with c/o headache and nausea. Pt states she had headaches with her other pregnancy and this one is like the ones she had with prev preg. Also states has nagging nausea, not throwing up but nausea most of time.  CSN: [id number]  Arrival date & time 07/27/16  1141   First Provider Initiated Contact with Patient 07/27/16 1245      Chief Complaint  Patient presents with  . Nausea  . Headache  . Dizziness    HPI  Past Medical History:  Diagnosis Date  . Anemia   . Fibroid   . GERD (gastroesophageal reflux disease)   . Pregnancy induced hypertension     Past Surgical History:  Procedure Laterality Date  . NO PAST SURGERIES      Family History  Problem Relation Age of Onset  . Hyperlipidemia Mother   . Hypertension Mother   . Varicose Veins Mother     Social History  Substance Use Topics  . Smoking status: Never Smoker  . Smokeless tobacco: Never Used  . Alcohol use No    OB History    Gravida Para Term Preterm AB Living   3 1   1 1 1    SAB TAB Ectopic Multiple Live Births   1       1      Review of Systems  Constitutional: Negative.   Eyes: Negative.   Respiratory: Negative.   Cardiovascular: Positive for chest pain.  Gastrointestinal: Positive for nausea.  Endocrine: Negative.   Genitourinary: Negative.   Musculoskeletal: Negative.   Skin: Negative.   Allergic/Immunologic: Negative.   Neurological: Positive for headaches.  Hematological: Negative.   Psychiatric/Behavioral: Negative.     Allergies  Review of patient's allergies indicates no known allergies.  Home Medications    BP 133/83 (BP Location: Right Arm)   Pulse 90   Temp 98.2 F (36.8 C) (Oral)   Resp 16   Ht 5' 3.5" (1.613 m)   Wt 155 lb 3.2 oz (70.4 kg)   LMP 03/28/2016   BMI 27.06 kg/m   Physical Exam  Constitutional: She is oriented to person, place, and time. She appears well-developed and well-nourished.  HENT:  Head: Normocephalic.   Eyes: Pupils are equal, round, and reactive to light.  Neck: Normal range of motion.  Cardiovascular: Normal rate, regular rhythm, normal heart sounds and intact distal pulses.   Pulmonary/Chest: Effort normal and breath sounds normal.  Abdominal: Soft.  Genitourinary: Vagina normal.  Musculoskeletal: Normal range of motion.  Neurological: She is alert and oriented to person, place, and time. She has normal reflexes.  Skin: Skin is warm and dry.  Psychiatric: She has a normal mood and affect. Her behavior is normal. Judgment and thought content normal.    MAU Course  Procedures (including critical care time)  Labs Reviewed  POCT PREGNANCY, URINE - Abnormal; Notable for the following:       Result Value   Preg Test, Ur POSITIVE (*)    All other components within normal limits  URINALYSIS, ROUTINE W REFLEX MICROSCOPIC (NOT AT [facility])   No results found.   No diagnosis found.    MDM  Bedside Vag probe US shows early IUP apx 4 wks.  Will tx nausea and headache if improves wiil d/c home.

## 2016-07-27 NOTE — MAU Note (Signed)
have severe nausea, severe headache and have been dizzy at times. Not feeling the best.  Started with the nausea

## 2016-08-23 ENCOUNTER — Encounter (HOSPITAL_COMMUNITY): Payer: Self-pay | Admitting: *Deleted

## 2016-08-23 ENCOUNTER — Inpatient Hospital Stay (HOSPITAL_COMMUNITY): Payer: BLUE CROSS/BLUE SHIELD

## 2016-08-23 ENCOUNTER — Inpatient Hospital Stay (HOSPITAL_COMMUNITY)
Admission: AD | Admit: 2016-08-23 | Discharge: 2016-08-23 | Disposition: A | Discharge status: Home / Self Care | Payer: BLUE CROSS/BLUE SHIELD | Source: Ambulatory Visit | Attending: Obstetrics and Gynecology | Admitting: Obstetrics and Gynecology

## 2016-08-23 DIAGNOSIS — K219 Gastro-esophageal reflux disease without esophagitis: Secondary | ICD-10-CM | POA: Diagnosis not present

## 2016-08-23 DIAGNOSIS — O99612 Diseases of the digestive system complicating pregnancy, second trimester: Secondary | ICD-10-CM | POA: Diagnosis not present

## 2016-08-23 DIAGNOSIS — O039 Complete or unspecified spontaneous abortion without complication: Secondary | ICD-10-CM | POA: Diagnosis not present

## 2016-08-23 DIAGNOSIS — D252 Subserosal leiomyoma of uterus: Secondary | ICD-10-CM | POA: Diagnosis not present

## 2016-08-23 DIAGNOSIS — O360110 Maternal care for anti-D [Rh] antibodies, first trimester, not applicable or unspecified: Secondary | ICD-10-CM

## 2016-08-23 DIAGNOSIS — Z8249 Family history of ischemic heart disease and other diseases of the circulatory system: Secondary | ICD-10-CM | POA: Insufficient documentation

## 2016-08-23 DIAGNOSIS — O3412 Maternal care for benign tumor of corpus uteri, second trimester: Secondary | ICD-10-CM | POA: Insufficient documentation

## 2016-08-23 DIAGNOSIS — Z3A01 Less than 8 weeks gestation of pregnancy: Secondary | ICD-10-CM | POA: Diagnosis not present

## 2016-08-23 DIAGNOSIS — O209 Hemorrhage in early pregnancy, unspecified: Secondary | ICD-10-CM | POA: Insufficient documentation

## 2016-08-23 LAB — URINALYSIS, ROUTINE W REFLEX MICROSCOPIC
BILIRUBIN URINE: NEGATIVE
GLUCOSE, UA: NEGATIVE mg/dL
Ketones, ur: 15 mg/dL — AB
Leukocytes, UA: NEGATIVE
Nitrite: NEGATIVE
Protein, ur: 30 mg/dL — AB
pH: 5.5 (ref 5.0–8.0)

## 2016-08-23 LAB — WET PREP, GENITAL
CLUE CELLS WET PREP: NONE SEEN
Sperm: NONE SEEN
Trich, Wet Prep: NONE SEEN
Yeast Wet Prep HPF POC: NONE SEEN

## 2016-08-23 LAB — CBC
HCT: 31.4 % — ABNORMAL LOW (ref 36.0–46.0)
Hemoglobin: 10.9 g/dL — ABNORMAL LOW (ref 12.0–15.0)
MCH: 30.1 pg (ref 26.0–34.0)
MCHC: 34.7 g/dL (ref 30.0–36.0)
MCV: 86.7 fL (ref 78.0–100.0)
PLATELETS: 189 10*3/uL (ref 150–400)
RBC: 3.62 MIL/uL — AB (ref 3.87–5.11)
RDW: 13.3 % (ref 11.5–15.5)
WBC: 6.1 10*3/uL (ref 4.0–10.5)

## 2016-08-23 LAB — URINE MICROSCOPIC-ADD ON

## 2016-08-23 LAB — HCG, QUANTITATIVE, PREGNANCY: HCG, BETA CHAIN, QUANT, S: 8888 m[IU]/mL — AB (ref <5)

## 2016-08-23 MED ORDER — RHO D IMMUNE GLOBULIN 1500 UNIT/2ML IJ SOSY
300.0000 ug | PREFILLED_SYRINGE | Freq: Once | INTRAMUSCULAR | Status: AC
  Administered 2016-08-23: 300 ug via INTRAMUSCULAR
  Filled 2016-08-23: qty 2

## 2016-08-23 NOTE — Discharge Instructions (Signed)
Return to care   If you have heavier bleeding that soaks through more that 2 pads per hour for an hour or more  If you bleed so much that you feel like you might pass out or you do pass out  If you have significant abdominal pain that is not improved with Tylenol   If you develop a fever > 100.5    Miscarriage A miscarriage is the sudden loss of an unborn baby (fetus) before the 20th week of pregnancy. Most miscarriages happen in the first 3 months of pregnancy. Sometimes, it happens before a woman even knows she is pregnant. A miscarriage is also called a "spontaneous miscarriage" or "early pregnancy loss." Having a miscarriage can be an emotional experience. Talk with your caregiver about any questions you may have about miscarrying, the grieving process, and your future pregnancy plans. CAUSES   Problems with the fetal chromosomes that make it impossible for the baby to develop normally. Problems with the baby's genes or chromosomes are most often the result of errors that occur, by chance, as the embryo divides and grows. The problems are not inherited from the parents.  Infection of the cervix or uterus.   Hormone problems.   Problems with the cervix, such as having an incompetent cervix. This is when the tissue in the cervix is not strong enough to hold the pregnancy.   Problems with the uterus, such as an abnormally shaped uterus, uterine fibroids, or congenital abnormalities.   Certain medical conditions.   Smoking, drinking alcohol, or taking illegal drugs.   Trauma.  Often, the cause of a miscarriage is unknown.  SYMPTOMS   Vaginal bleeding or spotting, with or without cramps or pain.  Pain or cramping in the abdomen or lower back.  Passing fluid, tissue, or blood clots from the vagina. DIAGNOSIS  Your caregiver will perform a physical exam. You may also have an ultrasound to confirm the miscarriage. Blood or urine tests may also be ordered. TREATMENT    Sometimes, treatment is not necessary if you naturally pass all the fetal tissue that was in the uterus. If some of the fetus or placenta remains in the body (incomplete miscarriage), tissue left behind may become infected and must be removed. Usually, a dilation and curettage (D and C) procedure is performed. During a D and C procedure, the cervix is widened (dilated) and any remaining fetal or placental tissue is gently removed from the uterus.  Antibiotic medicines are prescribed if there is an infection. Other medicines may be given to reduce the size of the uterus (contract) if there is a lot of bleeding.  If you have Rh negative blood and your baby was Rh positive, you will need a Rh immunoglobulin shot. This shot will protect any future baby from having Rh blood problems in future pregnancies. HOME CARE INSTRUCTIONS   Your caregiver may order bed rest or may allow you to continue light activity. Resume activity as directed by your caregiver.  Have someone help with home and family responsibilities during this time.   Keep track of the number of sanitary pads you use each day and how soaked (saturated) they are. Write down this information.   Do not use tampons. Do not douche or have sexual intercourse until approved by your caregiver.   Only take over-the-counter or prescription medicines for pain or discomfort as directed by your caregiver.   Do not take aspirin. Aspirin can cause bleeding.   Keep all follow-up appointments with your  caregiver.   If you or your partner have problems with grieving, talk to your caregiver or seek counseling to help cope with the pregnancy loss. Allow enough time to grieve before trying to get pregnant again.  SEEK IMMEDIATE MEDICAL CARE IF:   You have severe cramps or pain in your back or abdomen.  You have a fever.  You pass large blood clots (walnut-sized or larger) ortissue from your vagina. Save any tissue for your caregiver to  inspect.   Your bleeding increases.   You have a thick, bad-smelling vaginal discharge.  You become lightheaded, weak, or you faint.   You have chills.  MAKE SURE YOU:  Understand these instructions.  Will watch your condition.  Will get help right away if you are not doing well or get worse.   This information is not intended to replace advice given to you by your health care provider. Make sure you discuss any questions you have with your health care provider.   Document Released: 05/27/2001 Document Revised: 03/28/2013 Document Reviewed: 01/20/2012 Elsevier Interactive Patient Education 2016 [place].   Pelvic Rest Pelvic rest is sometimes recommended for women when:   The placenta is partially or completely covering the opening of the cervix (placenta previa).  There is bleeding between the uterine wall and the amniotic sac in the first trimester (subchorionic hemorrhage).  The cervix begins to open without labor starting (incompetent cervix, cervical insufficiency).  The labor is too early (preterm labor). HOME CARE INSTRUCTIONS  Do not have sexual intercourse, stimulation, or an orgasm.  Do not use tampons, douche, or put anything in the vagina.  Do not lift anything over 10 pounds (4.5 kg).  Avoid strenuous activity or straining your pelvic muscles. SEEK MEDICAL CARE IF:  You have any vaginal bleeding during pregnancy. Treat this as a potential emergency.  You have cramping pain felt low in the stomach (stronger than menstrual cramps).  You notice vaginal discharge (watery, mucus, or bloody).  You have a low, dull backache.  There are regular contractions or uterine tightening. SEEK IMMEDIATE MEDICAL CARE IF: You have vaginal bleeding and have placenta previa.    This information is not intended to replace advice given to you by your health care provider. Make sure you discuss any questions you have with your health care provider.   Document  Released: 03/28/2011 Document Revised: 02/23/2012 Document Reviewed: 06/04/2015 Elsevier Interactive Patient Education [employer].

## 2016-08-23 NOTE — MAU Provider Note (Signed)
History     CSN: [id number]  Arrival date and time: 08/23/16 1941   None     Chief Complaint  Patient presents with  . Vaginal Bleeding   HPI Alice Osborne is a 30 y.o. [id number] at [redacted]w[redacted]d by LMP who presents with vaginal bleeding and abdominal cramping. Reports spotting that began Tuesday after having intercourse. Bleeding increased this morning & associated with abdominal cramping. Reports heavy bleeding with some blood clots this morning. Since then abdominal cramping & vaginal bleeding has decreased. Currently rates pain 3/10. Has not treated.  Denies n/v/d, constipation, fever. Last BM this morning was normal.  Has not had ultrasound in office.  OB History    Gravida Para Term Preterm AB Living   3 1   1 1 1    SAB TAB Ectopic Multiple Live Births   1       1      Past Medical History:  Diagnosis Date  . Anemia   . Fibroid   . GERD (gastroesophageal reflux disease)   . Pregnancy induced hypertension     Past Surgical History:  Procedure Laterality Date  . NO PAST SURGERIES      Family History  Problem Relation Age of Onset  . Hyperlipidemia Mother   . Hypertension Mother   . Varicose Veins Mother     Social History  Substance Use Topics  . Smoking status: Never Smoker  . Smokeless tobacco: Never Used  . Alcohol use No    Allergies: No Known Allergies  Prescriptions Prior to Admission  Medication Sig Dispense Refill Last Dose  . acetaminophen (TYLENOL) 500 MG tablet Take 1,000 mg by mouth every 6 (six) hours as needed for mild pain, moderate pain or headache.    07/26/2016 at 2000  . butalbital-acetaminophen-caffeine (FIORICET) 50-325-40 MG tablet Take 1-2 tablets by mouth every 6 (six) hours as needed for headache. 20 tablet 0   . Multiple Vitamin (MULTIVITAMIN WITH MINERALS) TABS tablet Take 1 tablet by mouth daily.   07/27/2016 at Unknown time  . Prenatal Vit-Fe Fumarate-FA (PRENATAL MULTIVITAMIN) TABS Take 1 tablet by mouth daily.    07/27/2016 at Unknown  time  . promethazine (PHENERGAN) 12.5 MG tablet Take 1 tablet (12.5 mg total) by mouth every 6 (six) hours as needed for nausea or vomiting. 30 tablet 6     Review of Systems  Constitutional: Negative.   Gastrointestinal: Positive for abdominal pain. Negative for constipation, diarrhea, nausea and vomiting.  Genitourinary: Negative for dysuria.       + vaginal bleeding   Physical Exam   Blood pressure 107/63, pulse 93, temperature 98.4 F (36.9 C), resp. rate 16, weight 157 lb (71.2 kg), last menstrual period 03/28/2016, unknown if currently breastfeeding.  Physical Exam  Nursing note and vitals reviewed. Constitutional: She is oriented to person, place, and time. She appears well-developed and well-nourished. No distress.  HENT:  Head: Normocephalic and atraumatic.  Eyes: Conjunctivae are normal. Right eye exhibits no discharge. Left eye exhibits no discharge. No scleral icterus.  Neck: Normal range of motion.  Respiratory: Effort normal. No respiratory distress.  GI: Soft. She exhibits no distension. There is no tenderness.  Genitourinary: Uterus normal. Cervix exhibits no motion tenderness. Right adnexum displays no mass and no tenderness. Left adnexum displays no mass and no tenderness. There is bleeding (small amount of dark red blood; no active bleeding) in the vagina.  Neurological: She is alert and oriented to person, place, and time.  Skin: Skin is warm  and dry. She is not diaphoretic.  Psychiatric: She has a normal mood and affect. Her behavior is normal. Judgment and thought content normal.    MAU Course  Procedures Results for orders placed or performed during the hospital encounter of 08/23/16 (from the past 24 hour(s))  Urinalysis, Routine w reflex microscopic (not at [facility])     Status: Abnormal   Collection Time: 08/23/16  7:47 PM  Result Value Ref Range   Color, Urine YELLOW YELLOW   APPearance HAZY (A) CLEAR   Specific Gravity, Urine >1.030 (H) 1.005 - 1.030    pH 5.5 5.0 - 8.0   Glucose, UA NEGATIVE NEGATIVE mg/dL   Hgb urine dipstick LARGE (A) NEGATIVE   Bilirubin Urine NEGATIVE NEGATIVE   Ketones, ur 15 (A) NEGATIVE mg/dL   Protein, ur 30 (A) NEGATIVE mg/dL   Nitrite NEGATIVE NEGATIVE   Leukocytes, UA NEGATIVE NEGATIVE  Urine microscopic-add on     Status: Abnormal   Collection Time: 08/23/16  7:47 PM  Result Value Ref Range   Squamous Epithelial / LPF 6-30 (A) NONE SEEN   WBC, UA 0-5 0 - 5 WBC/hpf   RBC / HPF TOO NUMEROUS TO COUNT 0 - 5 RBC/hpf   Bacteria, UA RARE (A) NONE SEEN   Urine-Other MUCOUS PRESENT   Wet prep, genital     Status: Abnormal   Collection Time: 08/23/16  8:23 PM  Result Value Ref Range   Yeast Wet Prep HPF POC NONE SEEN NONE SEEN   Trich, Wet Prep NONE SEEN NONE SEEN   Clue Cells Wet Prep HPF POC NONE SEEN NONE SEEN   WBC, Wet Prep HPF POC FEW (A) NONE SEEN   Sperm NONE SEEN   CBC     Status: Abnormal   Collection Time: 08/23/16  8:33 PM  Result Value Ref Range   WBC 6.1 4.0 - 10.5 K/uL   RBC 3.62 (L) 3.87 - 5.11 MIL/uL   Hemoglobin 10.9 (L) 12.0 - 15.0 g/dL   HCT 31.4 (L) 36.0 - 46.0 %   MCV 86.7 78.0 - 100.0 fL   MCH 30.1 26.0 - 34.0 pg   MCHC 34.7 30.0 - 36.0 g/dL   RDW 13.3 11.5 - 15.5 %   Platelets 189 150 - 400 K/uL  hCG, quantitative, pregnancy     Status: Abnormal   Collection Time: 08/23/16  8:33 PM  Result Value Ref Range   hCG, Beta Chain, Quant, S 8,888 (H) <5 mIU/mL  Rh IG workup (includes ABO/Rh)     Status: None (Preliminary result)   Collection Time: 08/23/16  8:33 PM  Result Value Ref Range   Gestational Age(Wks) 7    ABO/RH(D) A NEG    Antibody Screen NEG    Unit Number [id number]    Blood Component Type RHIG    Unit division 00    Status of Unit ISSUED    Transfusion Status OK TO TRANSFUSE    US Ob Comp Less 14 Wks  Result Date: 08/23/2016 CLINICAL DATA:  30 year old female with vaginal bleeding and pelvic cramping. EXAM: OBSTETRIC <14 WK [place] AND TRANSVAGINAL OB US  TECHNIQUE: Both transabdominal and transvaginal ultrasound examinations were performed for complete evaluation of the gestation as well as the maternal uterus, adnexal regions, and pelvic cul-de-sac. Transvaginal technique was performed to assess early pregnancy. COMPARISON:  None. FINDINGS: The uterus is anteverted and heterogeneous. Multiple fibroids noted. A posterior uterine sub serosal fibroid measures 3.3 x 2.7 x 3.2 cm. A 2.4 x  2.8 x 2.3 cm anterior body exophytic fibroid noted. The endometrium is unremarkable. No intrauterine pregnancy identified. Correlation with clinical exam and HCG levels recommended. Please note with a positive HCG level and in the absence of documented intrauterine pregnancy ultrasound, the possibility of an ectopic pregnancy is not excluded. Correlation with serial HCG levels and follow-up with ultrasound recommended. The ovaries appear unremarkable. The right ovary measures 3.4 x 2.2 x 2.3 cm and the left ovary measures 3.6 x 2.2 x 2.7 cm. Trace free fluid noted within the pelvis. IMPRESSION: No intrauterine pregnancy identified. Correlation with clinical exam and follow-up with serial HCG levels and ultrasound recommended. Multiple uterine fibroids. Unremarkable ovaries. Electronically Signed   By: Anner Crete M.D.   On: 08/23/2016 21:26   US Ob Transvaginal  Result Date: 08/23/2016 CLINICAL DATA:  30 year old female with vaginal bleeding and pelvic cramping. EXAM: OBSTETRIC <14 WK [place] AND TRANSVAGINAL OB US TECHNIQUE: Both transabdominal and transvaginal ultrasound examinations were performed for complete evaluation of the gestation as well as the maternal uterus, adnexal regions, and pelvic cul-de-sac. Transvaginal technique was performed to assess early pregnancy. COMPARISON:  None. FINDINGS: The uterus is anteverted and heterogeneous. Multiple fibroids noted. A posterior uterine sub serosal fibroid measures 3.3 x 2.7 x 3.2 cm. A 2.4 x 2.8 x 2.3 cm anterior body exophytic  fibroid noted. The endometrium is unremarkable. No intrauterine pregnancy identified. Correlation with clinical exam and HCG levels recommended. Please note with a positive HCG level and in the absence of documented intrauterine pregnancy ultrasound, the possibility of an ectopic pregnancy is not excluded. Correlation with serial HCG levels and follow-up with ultrasound recommended. The ovaries appear unremarkable. The right ovary measures 3.4 x 2.2 x 2.3 cm and the left ovary measures 3.6 x 2.2 x 2.7 cm. Trace free fluid noted within the pelvis. IMPRESSION: No intrauterine pregnancy identified. Correlation with clinical exam and follow-up with serial HCG levels and ultrasound recommended. Multiple uterine fibroids. Unremarkable ovaries. Electronically Signed   By: Anner Crete M.D.   On: 08/23/2016 21:26    MDM A negative -- rhig ordered CBC, Bhcg, GC/CT, wet prep, ultrasound Ultrasound shows no IUP; BHCG >8000 S/w Dr. Cletis Media; pt had bedside u/s with IUP last month. Consistent with completed miscarriage. Pt should keep f/u with office on Monday.  [place] given  Assessment and Plan  A; 1. Miscarriage   2. Vaginal bleeding in pregnancy, first trimester   3. Rh negative state in antepartum period, first trimester, not applicable or unspecified fetus     P: Discharge home Pelvic rest Discussed reasons to return to MAU Keep appointment in office on Monday  Hazael Olveda 08/23/2016, 8:14 PM

## 2016-08-23 NOTE — MAU Note (Signed)
Starting bleeding Tuesday but light pink. Today got much worse with cramping and passing blood clots.  Bleeding has since slowed down and cramping has improved

## 2016-08-24 LAB — RH IG WORKUP (INCLUDES ABO/RH)
ABO/RH(D): A NEG
ANTIBODY SCREEN: NEGATIVE
Gestational Age(Wks): 7
UNIT DIVISION: 0

## 2016-08-24 LAB — HIV ANTIBODY (ROUTINE TESTING W REFLEX): HIV SCREEN 4TH GENERATION: NONREACTIVE

## 2016-08-25 LAB — GC/CHLAMYDIA PROBE AMP (~~LOC~~) NOT AT ARMC
CHLAMYDIA, DNA PROBE: NEGATIVE
NEISSERIA GONORRHEA: NEGATIVE

## 2016-12-15 NOTE — L&D Delivery Note (Signed)
Delivery Note At 415am  a viable female was delivered via  VTX (Presentation: LOA;  ).  APGAR:9,9 , ; weight  .  pending Placenta status: intact, .  Cord: 3 vessels  Anesthesia:  epidural Episiotomy:  none Lacerations:  none Suture Repair: 2.0 vicryl for hemastasis Est. Blood Loss (mL):  143ml  Mom to postpartum.  Baby to Couplet care / Skin to Skin.  Lori A Clemmons CNM 07/17/2017, 4:30 AM

## 2016-12-29 LAB — OB RESULTS CONSOLE GC/CHLAMYDIA
CHLAMYDIA, DNA PROBE: NEGATIVE
Gonorrhea: NEGATIVE

## 2016-12-29 LAB — OB RESULTS CONSOLE ANTIBODY SCREEN: ANTIBODY SCREEN: NEGATIVE

## 2016-12-29 LAB — OB RESULTS CONSOLE ABO/RH: RH Type: NEGATIVE

## 2016-12-29 LAB — OB RESULTS CONSOLE HEPATITIS B SURFACE ANTIGEN: HEP B S AG: NEGATIVE

## 2016-12-29 LAB — OB RESULTS CONSOLE HIV ANTIBODY (ROUTINE TESTING): HIV: NONREACTIVE

## 2016-12-29 LAB — OB RESULTS CONSOLE RPR: RPR: NONREACTIVE

## 2016-12-29 LAB — OB RESULTS CONSOLE RUBELLA ANTIBODY, IGM: Rubella: IMMUNE

## 2017-01-28 ENCOUNTER — Emergency Department (HOSPITAL_BASED_OUTPATIENT_CLINIC_OR_DEPARTMENT_OTHER)
Admission: EM | Admit: 2017-01-28 | Discharge: 2017-01-28 | Disposition: A | Discharge status: Home / Self Care | Payer: BLUE CROSS/BLUE SHIELD | Attending: Emergency Medicine | Admitting: Emergency Medicine

## 2017-01-28 ENCOUNTER — Encounter (HOSPITAL_BASED_OUTPATIENT_CLINIC_OR_DEPARTMENT_OTHER): Payer: Self-pay | Admitting: Emergency Medicine

## 2017-01-28 DIAGNOSIS — O26892 Other specified pregnancy related conditions, second trimester: Secondary | ICD-10-CM | POA: Diagnosis present

## 2017-01-28 DIAGNOSIS — R519 Headache, unspecified: Secondary | ICD-10-CM

## 2017-01-28 DIAGNOSIS — Z3A14 14 weeks gestation of pregnancy: Secondary | ICD-10-CM | POA: Diagnosis not present

## 2017-01-28 DIAGNOSIS — R51 Headache: Secondary | ICD-10-CM | POA: Insufficient documentation

## 2017-01-28 DIAGNOSIS — Z79899 Other long term (current) drug therapy: Secondary | ICD-10-CM | POA: Diagnosis not present

## 2017-01-28 HISTORY — DX: Headache, unspecified: R51.9

## 2017-01-28 HISTORY — DX: Headache: R51

## 2017-01-28 MED ORDER — METOCLOPRAMIDE HCL 5 MG/ML IJ SOLN
10.0000 mg | Freq: Once | INTRAMUSCULAR | Status: AC
  Administered 2017-01-28: 10 mg via INTRAVENOUS
  Filled 2017-01-28: qty 2

## 2017-01-28 MED ORDER — DIPHENHYDRAMINE HCL 50 MG/ML IJ SOLN
25.0000 mg | Freq: Once | INTRAMUSCULAR | Status: AC
  Administered 2017-01-28: 25 mg via INTRAVENOUS
  Filled 2017-01-28: qty 1

## 2017-01-28 MED ORDER — SODIUM CHLORIDE 0.9 % IV BOLUS (SEPSIS)
1000.0000 mL | Freq: Once | INTRAVENOUS | Status: AC
  Administered 2017-01-28: 1000 mL via INTRAVENOUS

## 2017-01-28 NOTE — ED Triage Notes (Addendum)
Pt having right sided headache since Monday.  Pt states it is similar to previous headaches but is more severe.  Pt taking Fioricet without help.  Pt is 3 months pregnant.

## 2017-01-28 NOTE — ED Notes (Signed)
ED Provider at bedside. 

## 2017-01-28 NOTE — Discharge Instructions (Signed)
Return to the ED with any concerns including vomiting, changes in vision or speech, weakness of arms or legs, fever/chills, fainting, decreased level of alertness/lethargy, or any other alarming symptoms

## 2017-01-28 NOTE — ED Provider Notes (Signed)
[facility] DEPT Provider Note   CSN: [id number] Arrival date & time: 01/28/17  1741  By signing my name below, I, Hansel Feinstein, attest that this documentation has been prepared under the direction and in the presence of Alfonzo Beers, MD. Electronically Signed: Hansel Feinstein, ED Scribe. 01/28/17. 7:29 PM.    History   Chief Complaint Chief Complaint  Patient presents with  . Headache    HPI Alice Osborne is a 31 y.o. female [redacted]w[redacted]d [id number] presents to the Emergency Department complaining of constant, throbbing, 9/10 right-sided HA that began 2 days ago with associated photophobia. Pt received an rx for Fioricet at the beginning of this pregnancy for increased frequency of HA, which she reports normally relieves her pain; however, she has not had any relief of her current HA with Fioricet. Pt reports h/o similar HAs with prior pregnancy, but slightly less severe. She denies visual changes, focal weakness or numbness, nausea, vomiting, fever.   The history is provided by the patient. No language interpreter was used.  Headache   This is a recurrent problem. The current episode started 2 days ago. The problem occurs constantly. The problem has not changed since onset.The headache is associated with bright light. The pain is located in the right unilateral region. The quality of the pain is described as throbbing. The pain is at a severity of 9/10. The pain is severe. The pain does not radiate. Pertinent negatives include no fever, no nausea and no vomiting. Treatments tried: Fioricet. The treatment provided no relief.    Past Medical History:  Diagnosis Date  . Anemia   . Fibroid   . GERD (gastroesophageal reflux disease)   . Headache   . Pregnancy induced hypertension     Patient Active Problem List   Diagnosis Date Noted  . Right leg swelling 03/06/2015  . NSVD (normal spontaneous vaginal delivery) 12/27/2013  . PIH (pregnancy induced hypertension), antepartum 12/24/2013  .  Preeclampsia, severe 12/23/2013    Past Surgical History:  Procedure Laterality Date  . NO PAST SURGERIES      OB History    Gravida Para Term Preterm AB Living   3 1   1 1 1    SAB TAB Ectopic Multiple Live Births   1       1       Home Medications    Prior to Admission medications   Medication Sig Start Date End Date Taking? Authorizing Provider  acetaminophen (TYLENOL) 500 MG tablet Take 1,000 mg by mouth every 6 (six) hours as needed for mild pain, moderate pain or headache.     Historical Provider, MD  butalbital-acetaminophen-caffeine [facility]) [phone] MG tablet Take 1-2 tablets by mouth every 6 (six) hours as needed for headache. 07/27/16 07/27/17  Keitha Butte, CNM  Multiple Vitamin (MULTIVITAMIN WITH MINERALS) TABS tablet Take 1 tablet by mouth daily.    Historical Provider, MD  Prenatal Vit-Fe Fumarate-FA (PRENATAL MULTIVITAMIN) TABS Take 1 tablet by mouth daily.     Historical Provider, MD  promethazine (PHENERGAN) 12.5 MG tablet Take 1 tablet (12.5 mg total) by mouth every 6 (six) hours as needed for nausea or vomiting. 07/27/16   Keitha Butte, CNM    Family History Family History  Problem Relation Age of Onset  . Hyperlipidemia Mother   . Hypertension Mother   . Varicose Veins Mother     Social History Social History  Substance Use Topics  . Smoking status: Never Smoker  . Smokeless tobacco: Never Used  .  Alcohol use No     Allergies   Patient has no known allergies.   Review of Systems Review of Systems  Constitutional: Negative for fever.  Eyes: Positive for photophobia. Negative for visual disturbance.  Gastrointestinal: Negative for nausea and vomiting.  Neurological: Positive for headaches. Negative for weakness and numbness.  All other systems reviewed and are negative.    Physical Exam Updated Vital Signs BP 116/83 (BP Location: Left Arm)   Pulse 85   Temp 98.1 F (36.7 C) (Oral)   Resp 16   Ht 5\' 4"  (1.626 m)   Wt 74.4 kg    LMP 10/21/2016   SpO2 100%   BMI 28.15 kg/m  Vitals reviewed Physical Exam Physical Examination: General appearance - alert, well appearing, and in no distress Mental status - alert, oriented to person, place, and time Eyes - pupils equal and reactive, extraocular eye movements intact  Mouth - mucous membranes moist, pharynx normal without lesions Neck - supple, no significant adenopathy Chest - clear to auscultation, no wheezes, rales or rhonchi, symmetric air entry Heart - normal rate, regular rhythm, normal S1, S2, no murmurs, rubs, clicks or gallops Abdomen - soft, nontender, nondistended, no masses or organomegaly Neurological - alert, oriented x 3, cranial nerves 2-12 tested and intact, strength 5/5 in extremities x 4, sensation intact Extremities - peripheral pulses normal, no pedal edema, no clubbing or cyanosis Skin - normal coloration and turgor, no rashes  ED Treatments / Results   DIAGNOSTIC STUDIES: Oxygen Saturation is 100% on RA, normal by my interpretation.    COORDINATION OF CARE: 7:27 PM Discussed treatment plan with pt at bedside which includes IVF, Reglan, Benadryl and pt agreed to plan.    Labs (all labs ordered are listed, but only abnormal results are displayed) Labs Reviewed - No data to display  EKG  EKG Interpretation None       Radiology No results found.  Procedures Procedures (including critical care time)  Medications Ordered in ED Medications  sodium chloride 0.9 % bolus 1,000 mL (0 mLs Intravenous Stopped 01/28/17 2028)  metoCLOPramide (REGLAN) injection 10 mg (10 mg Intravenous Given 01/28/17 1938)  diphenhydrAMINE (BENADRYL) injection 25 mg (25 mg Intravenous Given 01/28/17 1938)     Initial Impression / Assessment and Plan / ED Course  I have reviewed the triage vital signs and the nursing notes.  Pertinent labs & imaging results that were available during my care of the patient were reviewed by me and considered in my medical  decision making (see chart for details).    8:37 PM pt is feeling much improved on recheck. Headache is resolved.   Pt presents presents in early pregnancy  With headache.  She has been taking fioricet for headaches during pregnancy.  Today has worsened, and fioricet is not working.  Pt has normal neuro exam.  She feels much improved after migraine cocktail with reglan and benadryl.  Discharged with strict return precautions.  Pt agreeable with plan.  Final Clinical Impressions(s) / ED Diagnoses   Final diagnoses:  Bad headache    New Prescriptions Discharge Medication List as of 01/28/2017  8:38 PM     I personally performed the services described in this documentation, which was scribed in my presence. The recorded information has been reviewed and is accurate.     Alfonzo Beers, MD 01/29/17 2028

## 2017-06-01 ENCOUNTER — Encounter (HOSPITAL_COMMUNITY): Payer: Self-pay

## 2017-06-16 ENCOUNTER — Other Ambulatory Visit: Payer: Self-pay | Admitting: Obstetrics and Gynecology

## 2017-06-16 ENCOUNTER — Ambulatory Visit (HOSPITAL_COMMUNITY)
Admission: RE | Admit: 2017-06-16 | Discharge: 2017-06-16 | Disposition: A | Discharge status: Home / Self Care | Payer: BLUE CROSS/BLUE SHIELD | Source: Ambulatory Visit | Attending: Obstetrics and Gynecology | Admitting: Obstetrics and Gynecology

## 2017-06-16 DIAGNOSIS — Z3A34 34 weeks gestation of pregnancy: Secondary | ICD-10-CM | POA: Insufficient documentation

## 2017-06-16 DIAGNOSIS — I10 Essential (primary) hypertension: Secondary | ICD-10-CM

## 2017-06-16 DIAGNOSIS — O10019 Pre-existing essential hypertension complicating pregnancy, unspecified trimester: Secondary | ICD-10-CM | POA: Insufficient documentation

## 2017-06-16 IMAGING — US US MFM FETAL BPP W/O NON-STRESS
1 series · 13 of 22 positions shown · non-contrast
Comparison: none

[Series 1: us mfm fetal bpp w/o non-stress · 22 acquisitions, 13 frames shown]
[im 1/22]
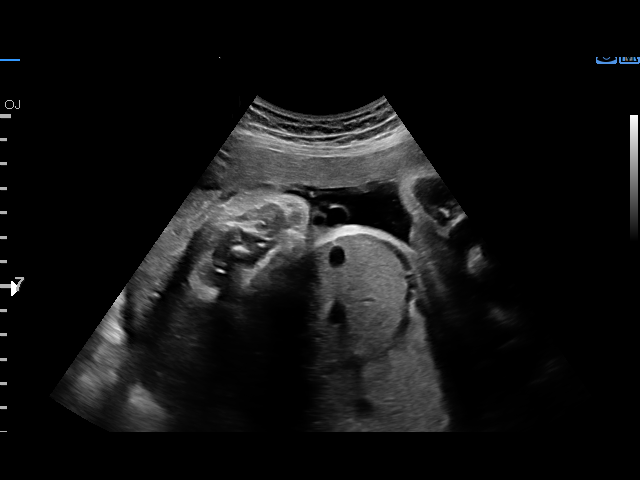
[im 3/22]
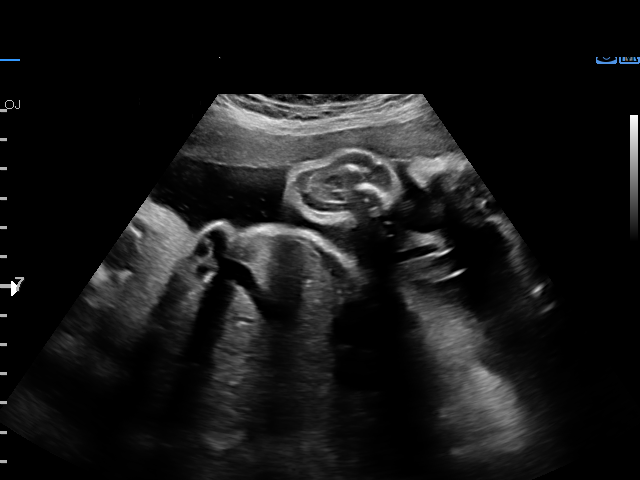
[im 5/22]
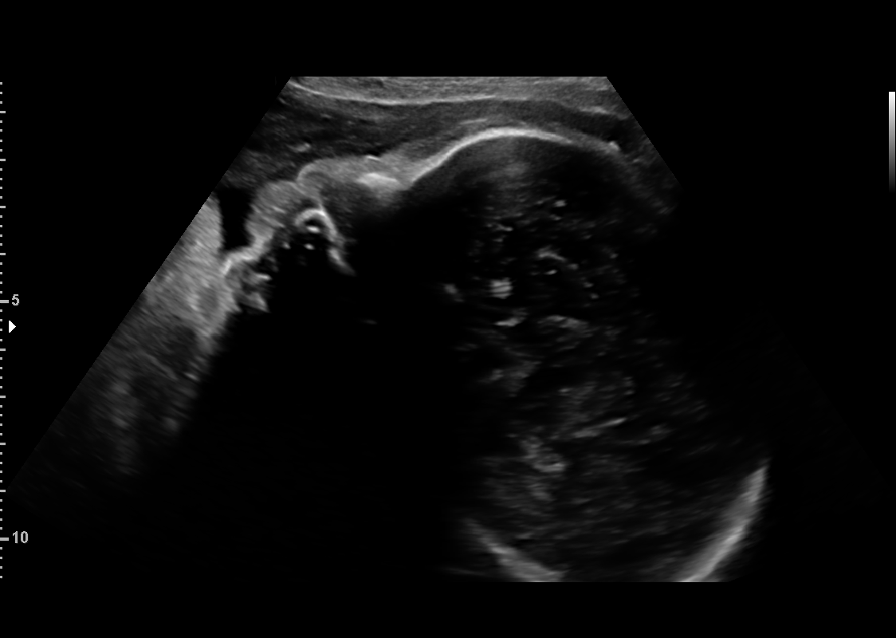
[im 6/22]
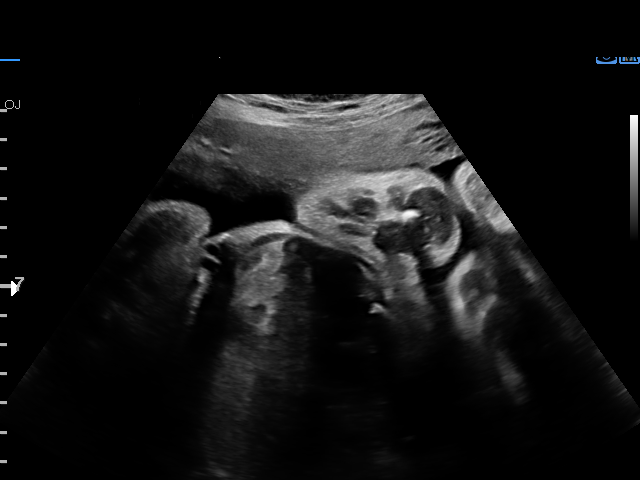
[im 8/22]
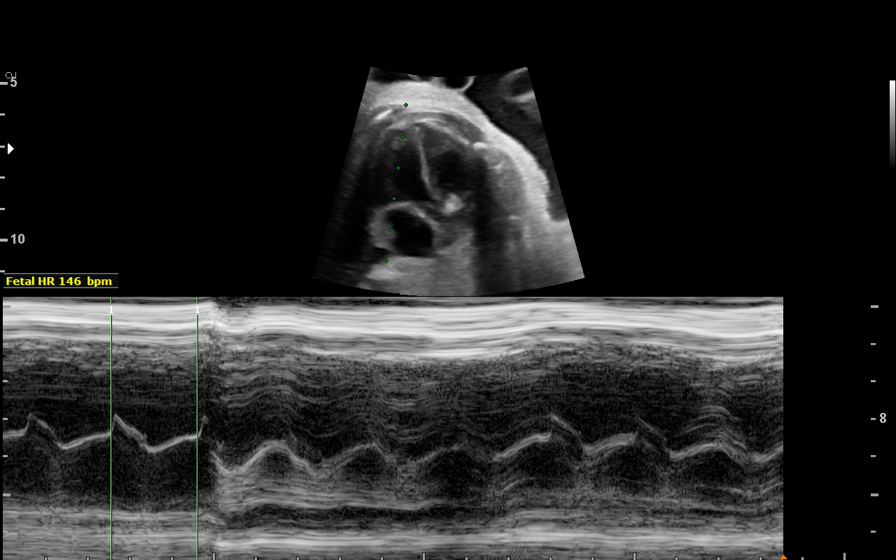
[im 10/22]
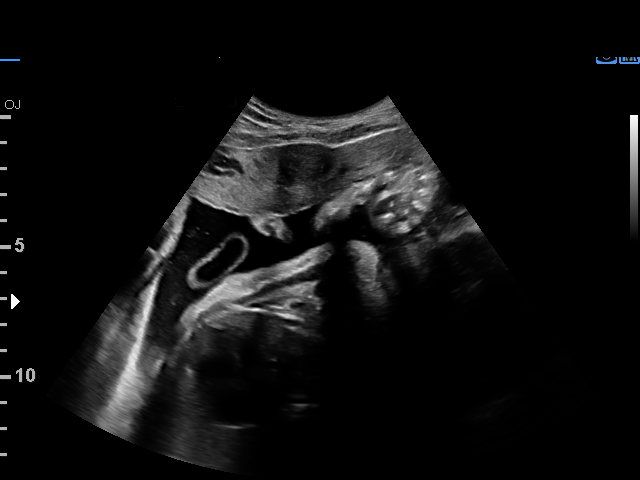
[im 12/22]
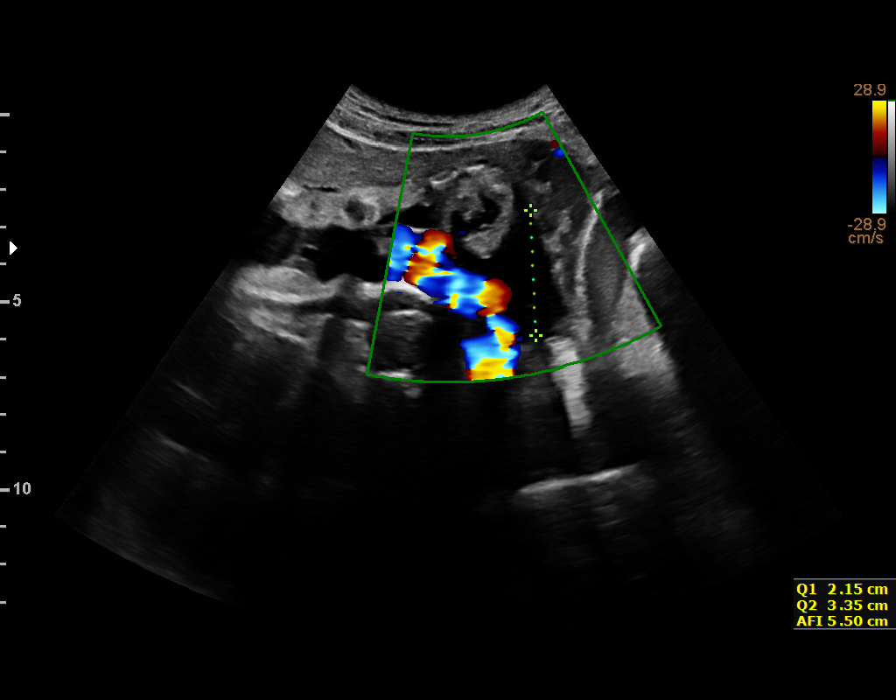
[im 13/22]
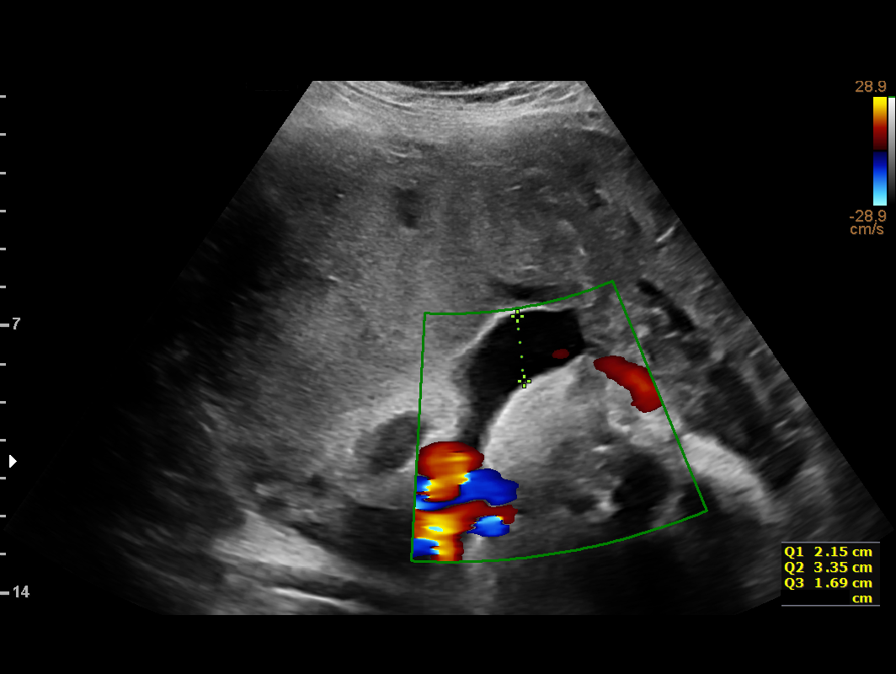
[im 15/22]
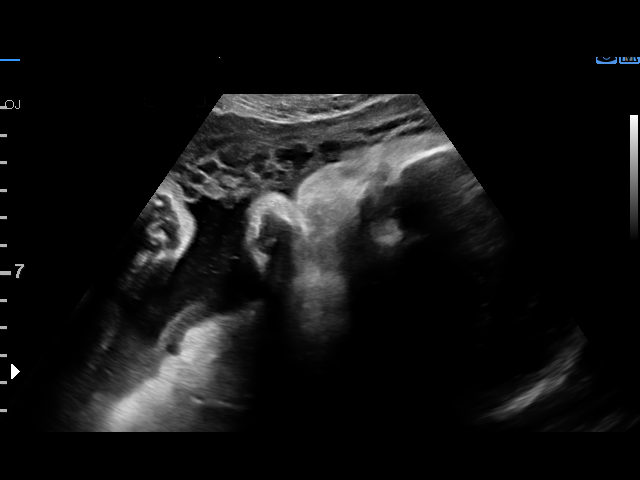
[im 17/22]
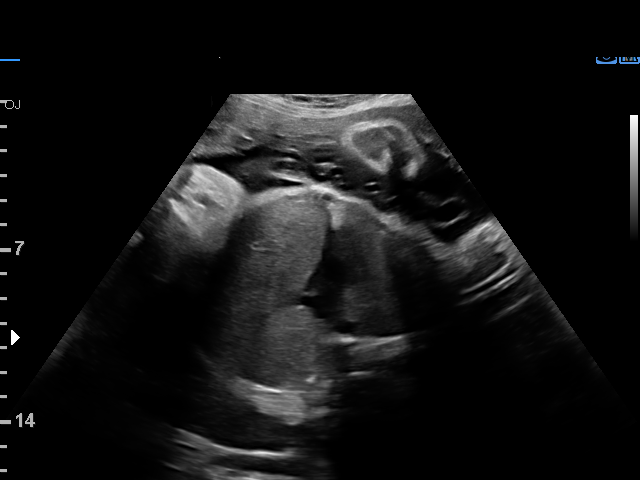
[im 18/22]
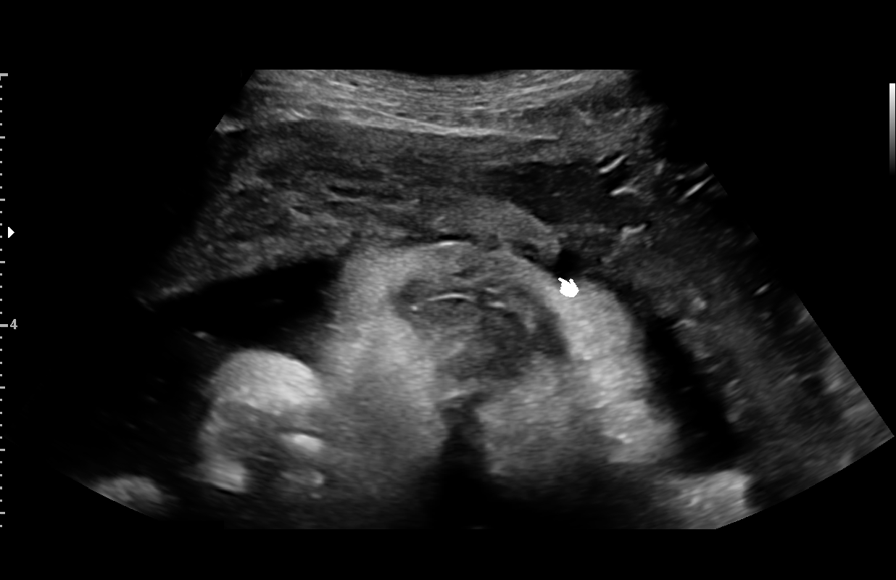
[im 20/22]
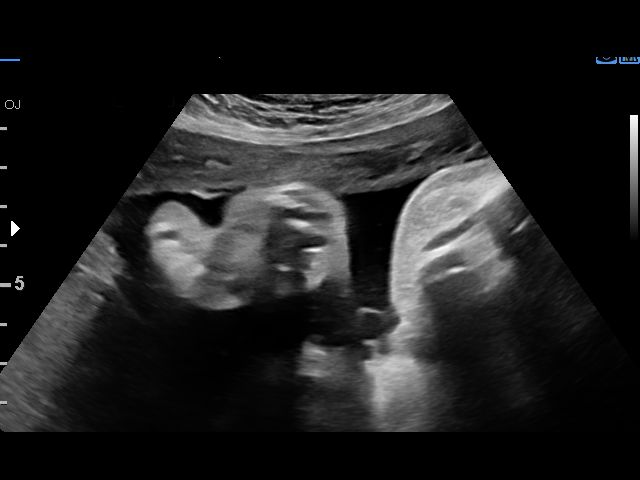
[im 22/22]
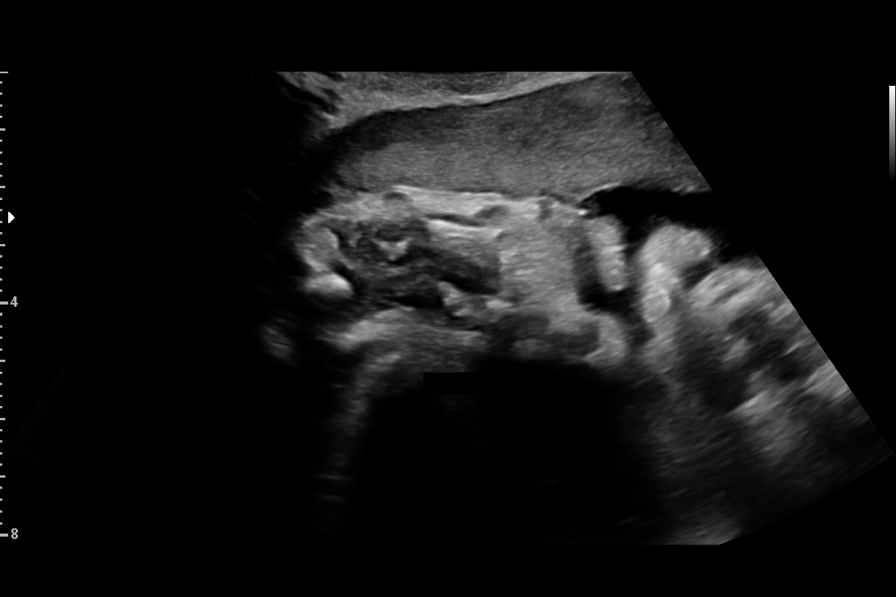

[13 of 22 positions shown; findings below may reference images not displayed]

Ave[address][HOSPITAL]

1  PAVEL [facility]           [id number]      [id number]     [id number]
Indications

34 weeks gestation of pregnancy
Hypertension - Chronic/Pre-existing
OB History

Blood Type:            Height:  5'4"   Weight (lb):  164       BMI:
Gravidity:    3         Prem:   1         SAB:   1
TOP:          0       Ectopic:  0        Living: 1
Fetal Evaluation

Num Of Fetuses:     1
Fetal Heart         146
Rate(bpm):
Cardiac Activity:   Observed
Presentation:       Cephalic

Amniotic Fluid
AFI FV:      Subjectively within normal limits

AFI Sum(cm)     %Tile       Largest Pocket(cm)
9.96            19

RUQ(cm)       RLQ(cm)       LUQ(cm)        LLQ(cm)
2.15
Biophysical Evaluation

Amniotic F.V:   Within normal limits       F. Tone:        Observed
F. Movement:    Observed                   Score:          [DATE]
F. Breathing:   Observed
Gestational Age

Clinical EDD:  34w 0d                                        EDD:   07/28/17
Best:          34w 0d     Det. By:  Clinical EDD             EDD:   07/28/17
Impression

IUP at  34+0 weeks with CHTN
Normal amniotic fluid volume
BPP [DATE]
Recommendations

Continue antepartum fetal testing regimen

## 2017-06-21 ENCOUNTER — Encounter (HOSPITAL_BASED_OUTPATIENT_CLINIC_OR_DEPARTMENT_OTHER): Payer: Self-pay | Admitting: Emergency Medicine

## 2017-06-21 ENCOUNTER — Emergency Department (HOSPITAL_BASED_OUTPATIENT_CLINIC_OR_DEPARTMENT_OTHER)
Admission: EM | Admit: 2017-06-21 | Discharge: 2017-06-21 | Disposition: A | Discharge status: Home / Self Care | Payer: BLUE CROSS/BLUE SHIELD | Attending: Emergency Medicine | Admitting: Emergency Medicine

## 2017-06-21 DIAGNOSIS — E860 Dehydration: Secondary | ICD-10-CM | POA: Diagnosis not present

## 2017-06-21 DIAGNOSIS — R51 Headache: Secondary | ICD-10-CM

## 2017-06-21 DIAGNOSIS — O9989 Other specified diseases and conditions complicating pregnancy, childbirth and the puerperium: Secondary | ICD-10-CM | POA: Diagnosis not present

## 2017-06-21 DIAGNOSIS — Z7982 Long term (current) use of aspirin: Secondary | ICD-10-CM | POA: Diagnosis not present

## 2017-06-21 DIAGNOSIS — Z79899 Other long term (current) drug therapy: Secondary | ICD-10-CM | POA: Insufficient documentation

## 2017-06-21 DIAGNOSIS — R519 Headache, unspecified: Secondary | ICD-10-CM

## 2017-06-21 DIAGNOSIS — Z3A34 34 weeks gestation of pregnancy: Secondary | ICD-10-CM

## 2017-06-21 DIAGNOSIS — O133 Gestational [pregnancy-induced] hypertension without significant proteinuria, third trimester: Secondary | ICD-10-CM | POA: Diagnosis not present

## 2017-06-21 LAB — URINALYSIS, ROUTINE W REFLEX MICROSCOPIC
BILIRUBIN URINE: NEGATIVE
GLUCOSE, UA: NEGATIVE mg/dL
Hgb urine dipstick: NEGATIVE
KETONES UR: NEGATIVE mg/dL
Leukocytes, UA: NEGATIVE
Nitrite: NEGATIVE
PH: 6.5 (ref 5.0–8.0)
Protein, ur: NEGATIVE mg/dL
SPECIFIC GRAVITY, URINE: 1.021 (ref 1.005–1.030)

## 2017-06-21 LAB — COMPREHENSIVE METABOLIC PANEL
ALBUMIN: 3 g/dL — AB (ref 3.5–5.0)
ALK PHOS: 98 U/L (ref 38–126)
ALT: 16 U/L (ref 14–54)
AST: 26 U/L (ref 15–41)
Anion gap: 8 (ref 5–15)
BUN: 10 mg/dL (ref 6–20)
CALCIUM: 9.1 mg/dL (ref 8.9–10.3)
CHLORIDE: 104 mmol/L (ref 101–111)
CO2: 24 mmol/L (ref 22–32)
CREATININE: 0.43 mg/dL — AB (ref 0.44–1.00)
GFR calc Af Amer: >60 mL/min (ref >60)
GFR calc non Af Amer: >60 mL/min (ref >60)
GLUCOSE: 78 mg/dL (ref 65–99)
Potassium: 3.5 mmol/L (ref 3.5–5.1)
SODIUM: 136 mmol/L (ref 135–145)
Total Bilirubin: 0.3 mg/dL (ref 0.3–1.2)
Total Protein: 6.9 g/dL (ref 6.5–8.1)

## 2017-06-21 LAB — CBC
HCT: 30.2 % — ABNORMAL LOW (ref 36.0–46.0)
HEMOGLOBIN: 10.8 g/dL — AB (ref 12.0–15.0)
MCH: 32.5 pg (ref 26.0–34.0)
MCHC: 35.8 g/dL (ref 30.0–36.0)
MCV: 91 fL (ref 78.0–100.0)
Platelets: 165 10*3/uL (ref 150–400)
RBC: 3.32 MIL/uL — AB (ref 3.87–5.11)
RDW: 13.5 % (ref 11.5–15.5)
WBC: 6.1 10*3/uL (ref 4.0–10.5)

## 2017-06-21 MED ORDER — METOCLOPRAMIDE HCL 5 MG/ML IJ SOLN
10.0000 mg | Freq: Once | INTRAMUSCULAR | Status: AC
Start: 2017-06-21 — End: 2017-06-21
  Administered 2017-06-21: 10 mg via INTRAVENOUS
  Filled 2017-06-21: qty 2

## 2017-06-21 MED ORDER — SODIUM CHLORIDE 0.9 % IV BOLUS (SEPSIS)
1000.0000 mL | Freq: Once | INTRAVENOUS | Status: AC
  Administered 2017-06-21: 1000 mL via INTRAVENOUS

## 2017-06-21 MED ORDER — DIPHENHYDRAMINE HCL 50 MG/ML IJ SOLN
25.0000 mg | Freq: Once | INTRAMUSCULAR | Status: AC
  Administered 2017-06-21: 25 mg via INTRAVENOUS
  Filled 2017-06-21: qty 1

## 2017-06-21 MED ORDER — SODIUM CHLORIDE 0.9 % IV SOLN: 1000.0000 mL | INTRAVENOUS | Status: DC

## 2017-06-21 NOTE — ED Notes (Signed)
Pt ambulatory to BR unassisted, in NAD. Family at her side.

## 2017-06-21 NOTE — ED Triage Notes (Signed)
Patient states that she has had a Headache for a few days. She is 34 weeks preg. The patient reports that when she has the headache it is to her right sinus and portion of her head. Paient took 3 extra strength tylenol this am. She reports that she is nauseated with the pain at this time

## 2017-06-21 NOTE — ED Provider Notes (Signed)
[facility] Provider Note   CSN: [id number] Arrival date & time: 06/21/17  1427 By signing my name below, I, Dyke Brackett, attest that this documentation has been prepared under the direction and in the presence of Isla Pence, MD . Electronically Signed: Dyke Brackett, Scribe. 06/21/2017. 4:33 PM.   History   Chief Complaint Chief Complaint  Patient presents with  . Headache    34 weeks preg   HPI Alice Osborne is a [redacted] week pregnant 31 y.o. female with a history of PIH and preeclampsia who presents to the Emergency Department complaining of intermittent, moderate right sinus headache onset a few days ago. She also reports associated nausea due to pain. She has taken Tylenol and Fioricet with no relief of pain. No alleviating or modifying factors noted.  Pt has experienced headaches throughout her pregnancy, but states this is the first time that Fioricet has been ineffective. She reports normal fetal movement. She denies any vaginal bleeding or abdominal pain. Pt has no other acute complaints or associated symptoms at this time.    The history is provided by the patient. No language interpreter was used.   Past Medical History:  Diagnosis Date  . Anemia   . Fibroid   . GERD (gastroesophageal reflux disease)   . Headache   . Pregnancy induced hypertension     Patient Active Problem List   Diagnosis Date Noted  . Right leg swelling 03/06/2015  . NSVD (normal spontaneous vaginal delivery) 12/27/2013  . PIH (pregnancy induced hypertension), antepartum 12/24/2013  . Preeclampsia, severe 12/23/2013   Past Surgical History:  Procedure Laterality Date  . NO PAST SURGERIES      OB History    Gravida Para Term Preterm AB Living   4 1   1 1 1    SAB TAB Ectopic Multiple Live Births   1       1       Home Medications    Prior to Admission medications   Medication Sig Start Date End Date Taking? Authorizing Provider  aspirin 81 MG chewable tablet Chew 81 mg by  mouth daily.   Yes [provider]  acetaminophen (TYLENOL) 500 MG tablet Take 1,000 mg by mouth every 6 (six) hours as needed for mild pain, moderate pain or headache.     [provider]  butalbital-acetaminophen-caffeine (FIORICET) [phone] MG tablet Take 1-2 tablets by mouth every 6 (six) hours as needed for headache. 07/27/16 07/27/17  Keitha Butte, CNM  Multiple Vitamin (MULTIVITAMIN WITH MINERALS) TABS tablet Take 1 tablet by mouth daily.    [provider]  Prenatal Vit-Fe Fumarate-FA (PRENATAL MULTIVITAMIN) TABS Take 1 tablet by mouth daily.     [provider]  promethazine (PHENERGAN) 12.5 MG tablet Take 1 tablet (12.5 mg total) by mouth every 6 (six) hours as needed for nausea or vomiting. 07/27/16   Keitha Butte, CNM    Family History Family History  Problem Relation Age of Onset  . Hyperlipidemia Mother   . Hypertension Mother   . Varicose Veins Mother     Social History Social History  Substance Use Topics  . Smoking status: Never Smoker  . Smokeless tobacco: Never Used  . Alcohol use No     Allergies   Patient has no known allergies.   Review of Systems Review of Systems All systems reviewed and are negative for acute change except as noted in the HPI.  Physical Exam Updated Vital Signs BP 129/84 (BP Location:  Left Arm)   Pulse 86   Temp 98.2 F (36.8 C) (Oral)   Resp 20   Ht 5\' 3"  (1.6 m)   Wt 191 lb 5.8 oz (86.8 kg)   LMP 10/21/2016   SpO2 100%   BMI 33.90 kg/m   Physical Exam  Constitutional: She is oriented to person, place, and time. She appears well-developed and well-nourished. No distress.  HENT:  Head: Normocephalic and atraumatic.  Eyes: Conjunctivae are normal.  Cardiovascular: Normal rate.   Pulmonary/Chest: Effort normal.  Abdominal: She exhibits no distension.  Gravida  Neurological: She is alert and oriented to person, place, and time.  Skin: Skin is warm and dry.  Nursing note and vitals  reviewed.  ED Treatments / Results  DIAGNOSTIC STUDIES:  Oxygen Saturation is 98% on RA, normal by my interpretation.    COORDINATION OF CARE:  4:30 PM Will order Reglan, benadryl and IV fluids. Discussed treatment plan with pt at bedside and pt agreed to plan.   Labs (all labs ordered are listed, but only abnormal results are displayed) Labs Reviewed  CBC - Abnormal; Notable for the following:       Result Value   RBC 3.32 (*)    Hemoglobin 10.8 (*)    HCT 30.2 (*)    All other components within normal limits  COMPREHENSIVE METABOLIC PANEL - Abnormal; Notable for the following:    Creatinine, Ser 0.43 (*)    Albumin 3.0 (*)    All other components within normal limits  URINALYSIS, ROUTINE W REFLEX MICROSCOPIC    EKG  EKG Interpretation None       Radiology No results found.  Procedures Procedures (including critical care time)  Medications Ordered in ED Medications  sodium chloride 0.9 % bolus 1,000 mL (1,000 mLs Intravenous New Bag/Given 06/21/17 1646)    Followed by  0.9 %  sodium chloride infusion (not administered)  metoCLOPramide (REGLAN) injection 10 mg (10 mg Intravenous Given 06/21/17 1647)  diphenhydrAMINE (BENADRYL) injection 25 mg (25 mg Intravenous Given 06/21/17 1646)     Initial Impression / Assessment and Plan / ED Course  I have reviewed the triage vital signs and the nursing notes.  Pertinent labs & imaging results that were available during my care of the patient were reviewed by me and considered in my medical decision making (see chart for details).    H/a gone after 1L NS and 10 mg reglan and 25 mg benadryl.  She knows to make sure she is drinking a lot of fluids.  Return if worse.  Baby is active and moving.  Final Clinical Impressions(s) / ED Diagnoses   Final diagnoses:  Acute nonintractable headache, unspecified headache type  Dehydration    New Prescriptions New Prescriptions   No medications on file   I personally performed  the services described in this documentation, which was scribed in my presence. The recorded information has been reviewed and is accurate.    Isla Pence, MD 06/21/17 [phone]

## 2017-07-02 ENCOUNTER — Other Ambulatory Visit (HOSPITAL_COMMUNITY): Payer: Self-pay

## 2017-07-02 LAB — OB RESULTS CONSOLE GBS: STREP GROUP B AG: POSITIVE

## 2017-07-08 ENCOUNTER — Encounter (HOSPITAL_COMMUNITY): Payer: Self-pay | Admitting: *Deleted

## 2017-07-08 ENCOUNTER — Telehealth (HOSPITAL_COMMUNITY): Payer: Self-pay | Admitting: *Deleted

## 2017-07-08 NOTE — Telephone Encounter (Signed)
Preadmission screen  

## 2017-07-16 ENCOUNTER — Encounter (HOSPITAL_COMMUNITY): Payer: Self-pay

## 2017-07-16 ENCOUNTER — Inpatient Hospital Stay (HOSPITAL_COMMUNITY)
Admission: AD | Admit: 2017-07-16 | Discharge: 2017-07-18 | DRG: 775 | Disposition: A | Discharge status: Home / Self Care | Payer: BLUE CROSS/BLUE SHIELD | Source: Ambulatory Visit | Attending: Obstetrics and Gynecology | Admitting: Obstetrics and Gynecology

## 2017-07-16 ENCOUNTER — Inpatient Hospital Stay (HOSPITAL_COMMUNITY): Payer: BLUE CROSS/BLUE SHIELD | Admitting: Anesthesiology

## 2017-07-16 DIAGNOSIS — O139 Gestational [pregnancy-induced] hypertension without significant proteinuria, unspecified trimester: Secondary | ICD-10-CM

## 2017-07-16 DIAGNOSIS — O133 Gestational [pregnancy-induced] hypertension without significant proteinuria, third trimester: Secondary | ICD-10-CM

## 2017-07-16 DIAGNOSIS — Z3A38 38 weeks gestation of pregnancy: Secondary | ICD-10-CM

## 2017-07-16 DIAGNOSIS — Z7982 Long term (current) use of aspirin: Secondary | ICD-10-CM

## 2017-07-16 DIAGNOSIS — Z6791 Unspecified blood type, Rh negative: Secondary | ICD-10-CM

## 2017-07-16 DIAGNOSIS — O99824 Streptococcus B carrier state complicating childbirth: Secondary | ICD-10-CM | POA: Diagnosis present

## 2017-07-16 DIAGNOSIS — B951 Streptococcus, group B, as the cause of diseases classified elsewhere: Secondary | ICD-10-CM | POA: Diagnosis present

## 2017-07-16 DIAGNOSIS — O26893 Other specified pregnancy related conditions, third trimester: Secondary | ICD-10-CM | POA: Diagnosis present

## 2017-07-16 DIAGNOSIS — O134 Gestational [pregnancy-induced] hypertension without significant proteinuria, complicating childbirth: Secondary | ICD-10-CM | POA: Diagnosis present

## 2017-07-16 DIAGNOSIS — R030 Elevated blood-pressure reading, without diagnosis of hypertension: Secondary | ICD-10-CM | POA: Diagnosis present

## 2017-07-16 LAB — COMPREHENSIVE METABOLIC PANEL
ALT: 18 U/L (ref 14–54)
AST: 30 U/L (ref 15–41)
Albumin: 2.9 g/dL — ABNORMAL LOW (ref 3.5–5.0)
Alkaline Phosphatase: 132 U/L — ABNORMAL HIGH (ref 38–126)
Anion gap: 8 (ref 5–15)
BUN: 13 mg/dL (ref 6–20)
CO2: 21 mmol/L — ABNORMAL LOW (ref 22–32)
Calcium: 9.5 mg/dL (ref 8.9–10.3)
Chloride: 106 mmol/L (ref 101–111)
Creatinine, Ser: 0.45 mg/dL (ref 0.44–1.00)
GFR calc Af Amer: >60 mL/min (ref >60)
GFR calc non Af Amer: >60 mL/min (ref >60)
Glucose, Bld: 100 mg/dL — ABNORMAL HIGH (ref 65–99)
Potassium: 3.9 mmol/L (ref 3.5–5.1)
Sodium: 135 mmol/L (ref 135–145)
Total Bilirubin: 0.5 mg/dL (ref 0.3–1.2)
Total Protein: 7.2 g/dL (ref 6.5–8.1)

## 2017-07-16 LAB — CBC
HCT: 32.3 % — ABNORMAL LOW (ref 36.0–46.0)
Hemoglobin: 11.5 g/dL — ABNORMAL LOW (ref 12.0–15.0)
MCH: 31.9 pg (ref 26.0–34.0)
MCHC: 35.6 g/dL (ref 30.0–36.0)
MCV: 89.7 fL (ref 78.0–100.0)
Platelets: 155 10*3/uL (ref 150–400)
RBC: 3.6 MIL/uL — ABNORMAL LOW (ref 3.87–5.11)
RDW: 13.7 % (ref 11.5–15.5)
WBC: 6 10*3/uL (ref 4.0–10.5)

## 2017-07-16 LAB — TYPE AND SCREEN
ABO/RH(D): A NEG
Antibody Screen: NEGATIVE

## 2017-07-16 LAB — PROTEIN / CREATININE RATIO, URINE
Creatinine, Urine: 90 mg/dL
Protein Creatinine Ratio: 0.14 mg/mg{Cre} (ref 0.00–0.15)
Total Protein, Urine: 13 mg/dL

## 2017-07-16 MED ORDER — OXYCODONE-ACETAMINOPHEN 5-325 MG PO TABS
1.0000 | ORAL_TABLET | Freq: Once | ORAL | Status: AC
  Administered 2017-07-16: 1 via ORAL
  Filled 2017-07-16: qty 1

## 2017-07-16 MED ORDER — PHENYLEPHRINE 40 MCG/ML (10ML) SYRINGE FOR IV PUSH (FOR BLOOD PRESSURE SUPPORT)
80.0000 ug | PREFILLED_SYRINGE | INTRAVENOUS | Status: DC | PRN
  Filled 2017-07-16: qty 5
  Filled 2017-07-16: qty 10

## 2017-07-16 MED ORDER — EPHEDRINE 5 MG/ML INJ
10.0000 mg | INTRAVENOUS | Status: DC | PRN
  Filled 2017-07-16: qty 2

## 2017-07-16 MED ORDER — LACTATED RINGERS IV SOLN
INTRAVENOUS | Status: DC
  Administered 2017-07-16: 22:00:00 via INTRAVENOUS

## 2017-07-16 MED ORDER — LIDOCAINE HCL (PF) 1 % IJ SOLN
30.0000 mL | INTRAMUSCULAR | Status: DC | PRN
  Filled 2017-07-16: qty 30

## 2017-07-16 MED ORDER — LIDOCAINE HCL (PF) 1 % IJ SOLN
INTRAMUSCULAR | Status: DC | PRN
  Administered 2017-07-16 (×2): 7 mL via EPIDURAL

## 2017-07-16 MED ORDER — ONDANSETRON HCL 4 MG/2ML IJ SOLN: 4.0000 mg | Freq: Four times a day (QID) | INTRAMUSCULAR | Status: DC | PRN

## 2017-07-16 MED ORDER — SOD CITRATE-CITRIC ACID 500-334 MG/5ML PO SOLN: 30.0000 mL | ORAL | Status: DC | PRN

## 2017-07-16 MED ORDER — FENTANYL 2.5 MCG/ML BUPIVACAINE 1/10 % EPIDURAL INFUSION (WH - ANES)
14.0000 mL/h | INTRAMUSCULAR | Status: DC | PRN
  Administered 2017-07-16: 14 mL/h via EPIDURAL
  Filled 2017-07-16: qty 100

## 2017-07-16 MED ORDER — LACTATED RINGERS IV SOLN: 500.0000 mL | Freq: Once | INTRAVENOUS | Status: DC

## 2017-07-16 MED ORDER — OXYTOCIN 40 UNITS IN LACTATED RINGERS INFUSION - SIMPLE MED
2.5000 [IU]/h | INTRAVENOUS | Status: DC
  Administered 2017-07-17: 2.5 [IU]/h via INTRAVENOUS
  Filled 2017-07-16: qty 1000

## 2017-07-16 MED ORDER — LACTATED RINGERS IV SOLN: 500.0000 mL | INTRAVENOUS | Status: DC | PRN

## 2017-07-16 MED ORDER — DIPHENHYDRAMINE HCL 50 MG/ML IJ SOLN
12.5000 mg | INTRAMUSCULAR | Status: AC | PRN
  Administered 2017-07-17 (×3): 12.5 mg via INTRAVENOUS
  Filled 2017-07-16: qty 1

## 2017-07-16 MED ORDER — PENICILLIN G POT IN DEXTROSE 60000 UNIT/ML IV SOLN
3.0000 10*6.[IU] | INTRAVENOUS | Status: DC
  Administered 2017-07-17: 3 10*6.[IU] via INTRAVENOUS
  Filled 2017-07-16 (×9): qty 50

## 2017-07-16 MED ORDER — FENTANYL CITRATE (PF) 100 MCG/2ML IJ SOLN: 50.0000 ug | INTRAMUSCULAR | Status: DC | PRN

## 2017-07-16 MED ORDER — ACETAMINOPHEN 325 MG PO TABS
650.0000 mg | ORAL_TABLET | ORAL | Status: DC | PRN
  Filled 2017-07-16 (×2): qty 2

## 2017-07-16 MED ORDER — PENICILLIN G POTASSIUM 5000000 UNITS IJ SOLR
5.0000 10*6.[IU] | Freq: Once | INTRAVENOUS | Status: AC
  Administered 2017-07-16: 5 10*6.[IU] via INTRAVENOUS
  Filled 2017-07-16: qty 5

## 2017-07-16 MED ORDER — FLEET ENEMA 7-19 GM/118ML RE ENEM: 1.0000 | ENEMA | RECTAL | Status: DC | PRN

## 2017-07-16 MED ORDER — OXYTOCIN BOLUS FROM INFUSION
500.0000 mL | Freq: Once | INTRAVENOUS | Status: AC
  Administered 2017-07-17: 500 mL via INTRAVENOUS

## 2017-07-16 MED ORDER — OXYCODONE-ACETAMINOPHEN 5-325 MG PO TABS: 2.0000 | ORAL_TABLET | ORAL | Status: DC | PRN

## 2017-07-16 MED ORDER — PHENYLEPHRINE 40 MCG/ML (10ML) SYRINGE FOR IV PUSH (FOR BLOOD PRESSURE SUPPORT)
80.0000 ug | PREFILLED_SYRINGE | INTRAVENOUS | Status: DC | PRN
  Filled 2017-07-16: qty 5

## 2017-07-16 MED ORDER — OXYCODONE-ACETAMINOPHEN 5-325 MG PO TABS: 1.0000 | ORAL_TABLET | ORAL | Status: DC | PRN

## 2017-07-16 NOTE — MAU Note (Signed)
+  contractions 7 min apart Started at noon and has gotten worse  Denies LOF or VB. +FM  Last VE closed 2 weeks ago

## 2017-07-16 NOTE — MAU Note (Cosign Needed)
History     CSN: [id number]  Arrival date & time 07/16/17  1823   None     Chief Complaint  Patient presents with  . Contractions    [id number] @ [redacted]w[redacted]d  is here for a labor check but had some elevated blood pressures probably related to contraction pain. Denies any problems in this pregnancy. History of preeclampsia in first pregnancy    Past Medical History:  Diagnosis Date  . Anemia   . Fibroid   . GERD (gastroesophageal reflux disease)   . Headache   . Pregnancy induced hypertension     Past Surgical History:  Procedure Laterality Date  . NO PAST SURGERIES      Family History  Problem Relation Age of Onset  . Hyperlipidemia Mother   . Hypertension Mother   . Varicose Veins Mother     Social History  Substance Use Topics  . Smoking status: Never Smoker  . Smokeless tobacco: Never Used  . Alcohol use No    OB History    Gravida Para Term Preterm AB Living   4 1   1 2 1    SAB TAB Ectopic Multiple Live Births   2       1      Review of Systems  Eyes: Negative for visual disturbance.  Gastrointestinal: Positive for abdominal pain.       Contractions  Genitourinary: Positive for pelvic pain.  Neurological: Negative for headaches.    Allergies  Patient has no known allergies.  Home Medications    BP (!) 140/92 (BP Location: Right Arm)   Pulse 77   Temp 97.9 F (36.6 C) (Oral)   Resp 16   Wt 197 lb 1.9 oz (89.4 kg)   LMP 10/21/2016   SpO2 100%   BMI 34.92 kg/m   Physical Exam  Constitutional: She is oriented to person, place, and time. She appears well-developed and well-nourished. No distress.  HENT:  Head: Normocephalic and atraumatic.  Neck: Normal range of motion.  Cardiovascular: Normal rate and regular rhythm.   Pulmonary/Chest: Effort normal and breath sounds normal.  Abdominal: Soft. There is no tenderness.  Genitourinary: Vagina normal.  Musculoskeletal: Normal range of motion.  Neurological: She is alert and oriented to person,  place, and time.  Skin: Skin is warm and dry.  Psychiatric: She has a normal mood and affect. Her behavior is normal.  Nursing note and vitals reviewed.  FHR -Category 1 contractions 5-9 minutes apart MAU Course  Procedures Pt denies any Preeclamptic symptoms such as headache, visual disturbances or epigastric pain. Pre-E labs drawn. Platelets 155- wnl. Percocet given for contraction pain. Labs Reviewed  CBC - Abnormal; Notable for the following:       Result Value   RBC 3.60 (*)    Hemoglobin 11.5 (*)    HCT 32.3 (*)    All other components within normal limits  COMPREHENSIVE METABOLIC PANEL  PROTEIN / CREATININE RATIO, URINE   No results found.   No diagnosis found.    MDM

## 2017-07-16 NOTE — Anesthesia Preprocedure Evaluation (Signed)
Anesthesia Evaluation  Patient identified by MRN, date of birth, ID band Patient awake    Reviewed: Allergy & Precautions, H&P , NPO status , Patient's Chart, lab work & pertinent test results  Airway Mallampati: II  TM Distance: >3 FB Neck ROM: full    Dental no notable dental hx. (+) Teeth Intact   Pulmonary neg pulmonary ROS,    Pulmonary exam normal        Cardiovascular Normal cardiovascular exam Rhythm:Regular Rate:Normal     Neuro/Psych negative psych ROS   GI/Hepatic Neg liver ROS,   Endo/Other    Renal/GU negative Renal ROS     Musculoskeletal   Abdominal (+) + obese,   Peds  Hematology  (+) Blood dyscrasia, anemia ,   Anesthesia Other Findings   Reproductive/Obstetrics (+) Pregnancy                             Anesthesia Physical  Anesthesia Plan  ASA: II  Anesthesia Plan: Epidural   Post-op Pain Management:    Induction:   PONV Risk Score and Plan:   Airway Management Planned:   Additional Equipment:   Intra-op Plan:   Post-operative Plan:   Informed Consent: I have reviewed the patients History and Physical, chart, labs and discussed the procedure including the risks, benefits and alternatives for the proposed anesthesia with the patient or authorized representative who has indicated his/her understanding and acceptance.     Plan Discussed with:   Anesthesia Plan Comments:         Anesthesia Quick Evaluation

## 2017-07-16 NOTE — Anesthesia Procedure Notes (Signed)
Epidural Patient location during procedure: OB Start time: 07/16/2017 10:57 PM End time: 07/16/2017 11:00 PM  Staffing Anesthesiologist: Lyn Hollingshead Performed: anesthesiologist   Preanesthetic Checklist Completed: patient identified, surgical consent, pre-op evaluation, timeout performed, IV checked, risks and benefits discussed and monitors and equipment checked  Epidural Patient position: sitting Prep: site prepped and draped and DuraPrep Patient monitoring: continuous pulse ox and blood pressure Approach: midline Location: L3-L4 Injection technique: LOR air  Needle:  Needle type: Tuohy  Needle gauge: 17 G Needle length: 9 cm and 9 Needle insertion depth: 6 cm Catheter type: closed end flexible Catheter size: 19 Gauge Catheter at skin depth: 11 cm Test dose: negative and Other  Assessment Sensory level: T9 Events: blood not aspirated, injection not painful, no injection resistance, negative IV test and no paresthesia  Additional Notes Reason for block:procedure for pain

## 2017-07-16 NOTE — H&P (Signed)
LABOR ADMISSION HISTORY AND PHYSICAL  Alice Osborne is a 31 y.o. female [phone] with IUP at [redacted]w[redacted]d  presenting for labor and initial finding of elevated blood pressures. She reports history of preeclampsia with first baby.. She reports +FMs, No LOF, no VB, no blurry vision, headaches or peripheral edema, and RUQ pain.    Prenatal History/Complications:  Past Medical History: Past Medical History:  Diagnosis Date  . Anemia   . Fibroid   . GERD (gastroesophageal reflux disease)   . Headache   . Pregnancy induced hypertension     Past Surgical History: Past Surgical History:  Procedure Laterality Date  . NO PAST SURGERIES      Obstetrical History: OB History    Gravida Para Term Preterm AB Living   4 1   1 2 1    SAB TAB Ectopic Multiple Live Births   2       1      Social History: Social History   Social History  . Marital status: Married    Spouse name: N/A  . Number of children: N/A  . Years of education: N/A   Social History Main Topics  . Smoking status: Never Smoker  . Smokeless tobacco: Never Used  . Alcohol use No  . Drug use: No  . Sexual activity: Yes    Birth control/ protection: None   Other Topics Concern  . None   Social History Narrative  . None    Family History: Family History  Problem Relation Age of Onset  . Hyperlipidemia Mother   . Hypertension Mother   . Varicose Veins Mother     Allergies: No Known Allergies  Prescriptions Prior to Admission  Medication Sig Dispense Refill Last Dose  . acetaminophen (TYLENOL) 500 MG tablet Take 1,000 mg by mouth every 6 (six) hours as needed for mild pain, moderate pain or headache.    Past Week at Unknown time  . aspirin 81 MG chewable tablet Chew 81 mg by mouth daily.   07/16/2017 at Unknown time  . butalbital-acetaminophen-caffeine (FIORICET) 50-325-40 MG tablet Take 1-2 tablets by mouth every 6 (six) hours as needed for headache. 20 tablet 0 Past Month at Unknown time  . IRON PO Take 1 tablet  by mouth daily.   07/16/2017 at Unknown time  . Prenatal Vit-Fe Fumarate-FA (PRENATAL MULTIVITAMIN) TABS Take 1 tablet by mouth daily.    07/16/2017 at Unknown time  . promethazine (PHENERGAN) 12.5 MG tablet Take 1 tablet (12.5 mg total) by mouth every 6 (six) hours as needed for nausea or vomiting. 30 tablet 6 07/15/2017 at Unknown time     Review of Systems   All systems reviewed and negative except as stated in HPI  Blood pressure (!) 144/91, pulse 65, temperature (!) 96.7 F (35.9 C), temperature source Oral, resp. rate 16, weight 197 lb 1.9 oz (89.4 kg), last menstrual period 10/21/2016, SpO2 100 %, unknown if currently breastfeeding. General appearance: alert, cooperative and appears stated age Lungs: clear to auscultation bilaterally Heart: regular rate and rhythm Abdomen: soft, non-tender; bowel sounds normal Pelvic: wnl 3/70/-2 Extremities: Homans sign is negative, no sign of DVT DTR's wnl Presentation: cephalic Fetal monitoringAccelerations: category 1 and reassurring Uterine activityDate/time of onset: 700am this morning; q2-5 minutes Dilation: 3 Effacement (%): 70 Station: -3 Exam by:: Iven Finn   Prenatal labs: ABO, Rh: A/Negative/-- (01/15 0000) Antibody: Negative (01/15 0000) Rubella: !Error! RPR: Nonreactive (01/15 0000)  HBsAg: Negative (01/15 0000)  HIV: Non-reactive (01/15 0000)  GBS:  Positive (07/19 0000)    Prenatal Transfer Tool  Maternal Diabetes: No Genetic Screening: Normal Maternal Ultrasounds/Referrals: Normal Fetal Ultrasounds or other Referrals:  None Maternal Substance Abuse:  No Significant Maternal Medications:  None Significant Maternal Lab Results: Lab values include: Group B Strep positive, Rh negative  Results for orders placed or performed during the hospital encounter of 07/16/17 (from the past 24 hour(s))  Protein / creatinine ratio, urine   Collection Time: 07/16/17  7:25 PM  Result Value Ref Range   Creatinine, Urine 90.00 mg/dL    Total Protein, Urine 13 mg/dL   Protein Creatinine Ratio 0.14 0.00 - 0.15 mg/mg[Cre]  CBC   Collection Time: 07/16/17  7:35 PM  Result Value Ref Range   WBC 6.0 4.0 - 10.5 K/uL   RBC 3.60 (L) 3.87 - 5.11 MIL/uL   Hemoglobin 11.5 (L) 12.0 - 15.0 g/dL   HCT 32.3 (L) 36.0 - 46.0 %   MCV 89.7 78.0 - 100.0 fL   MCH 31.9 26.0 - 34.0 pg   MCHC 35.6 30.0 - 36.0 g/dL   RDW 13.7 11.5 - 15.5 %   Platelets 155 150 - 400 K/uL  Comprehensive metabolic panel   Collection Time: 07/16/17  7:35 PM  Result Value Ref Range   Sodium 135 135 - 145 mmol/L   Potassium 3.9 3.5 - 5.1 mmol/L   Chloride 106 101 - 111 mmol/L   CO2 21 (L) 22 - 32 mmol/L   Glucose, Bld 100 (H) 65 - 99 mg/dL   BUN 13 6 - 20 mg/dL   Creatinine, Ser 0.45 0.44 - 1.00 mg/dL   Calcium 9.5 8.9 - 10.3 mg/dL   Total Protein 7.2 6.5 - 8.1 g/dL   Albumin 2.9 (L) 3.5 - 5.0 g/dL   AST 30 15 - 41 U/L   ALT 18 14 - 54 U/L   Alkaline Phosphatase 132 (H) 38 - 126 U/L   Total Bilirubin 0.5 0.3 - 1.2 mg/dL   GFR calc non Af Amer >60 >60 mL/min   GFR calc Af Amer >60 >60 mL/min   Anion gap 8 5 - 15    Patient Active Problem List   Diagnosis Date Noted  . Indication for care in labor or delivery 07/16/2017  . Positive GBS test 07/16/2017  . Blood type, Rh negative 07/16/2017  . Gestational hypertension 07/16/2017  . Right leg swelling 03/06/2015    Assessment: Kristyna Bradstreet is a 30 y.o. [id number] at [redacted]w[redacted]d here for Labor and elevated blood pressures  #Labor: Early #Pain: Planning epidural #ID:  GBS positive #MOF: Breast GBS Prophylaxis Epidural Expectant management Anticipate vaginal delivery Yvonne Kendall CNM  07/16/2017, 9:44 PM

## 2017-07-17 ENCOUNTER — Encounter (HOSPITAL_COMMUNITY): Payer: Self-pay

## 2017-07-17 LAB — MRSA PCR SCREENING
MRSA by PCR: INVALID — AB
MRSA by PCR: NEGATIVE

## 2017-07-17 LAB — RPR: RPR Ser Ql: NONREACTIVE

## 2017-07-17 MED ORDER — ONDANSETRON HCL 4 MG PO TABS: 4.0000 mg | ORAL_TABLET | ORAL | Status: DC | PRN

## 2017-07-17 MED ORDER — ONDANSETRON HCL 4 MG/2ML IJ SOLN: 4.0000 mg | INTRAMUSCULAR | Status: DC | PRN

## 2017-07-17 MED ORDER — DIPHENHYDRAMINE HCL 25 MG PO CAPS: 25.0000 mg | ORAL_CAPSULE | Freq: Four times a day (QID) | ORAL | Status: DC | PRN

## 2017-07-17 MED ORDER — ACETAMINOPHEN 325 MG PO TABS
650.0000 mg | ORAL_TABLET | ORAL | Status: DC | PRN
  Administered 2017-07-17 (×2): 650 mg via ORAL

## 2017-07-17 MED ORDER — BENZOCAINE-MENTHOL 20-0.5 % EX AERO: 1.0000 "application " | INHALATION_SPRAY | CUTANEOUS | Status: DC | PRN

## 2017-07-17 MED ORDER — SENNOSIDES-DOCUSATE SODIUM 8.6-50 MG PO TABS
2.0000 | ORAL_TABLET | ORAL | Status: DC
  Administered 2017-07-17: 2 via ORAL
  Filled 2017-07-17: qty 2

## 2017-07-17 MED ORDER — PRENATAL MULTIVITAMIN CH
1.0000 | ORAL_TABLET | Freq: Every day | ORAL | Status: DC
  Administered 2017-07-18: 1 via ORAL
  Filled 2017-07-17: qty 1

## 2017-07-17 MED ORDER — WITCH HAZEL-GLYCERIN EX PADS: 1.0000 "application " | MEDICATED_PAD | CUTANEOUS | Status: DC | PRN

## 2017-07-17 MED ORDER — IBUPROFEN 600 MG PO TABS
600.0000 mg | ORAL_TABLET | Freq: Four times a day (QID) | ORAL | Status: DC
  Administered 2017-07-17 – 2017-07-18 (×5): 600 mg via ORAL
  Filled 2017-07-17 (×5): qty 1

## 2017-07-17 MED ORDER — OXYCODONE HCL 5 MG PO TABS: 5.0000 mg | ORAL_TABLET | ORAL | Status: DC | PRN

## 2017-07-17 MED ORDER — CHLORHEXIDINE GLUCONATE CLOTH 2 % EX PADS: 6.0000 | MEDICATED_PAD | Freq: Every day | CUTANEOUS | Status: DC

## 2017-07-17 MED ORDER — OXYCODONE HCL 5 MG PO TABS: 10.0000 mg | ORAL_TABLET | ORAL | Status: DC | PRN

## 2017-07-17 MED ORDER — ZOLPIDEM TARTRATE 5 MG PO TABS: 5.0000 mg | ORAL_TABLET | Freq: Every evening | ORAL | Status: DC | PRN

## 2017-07-17 MED ORDER — SIMETHICONE 80 MG PO CHEW: 80.0000 mg | CHEWABLE_TABLET | ORAL | Status: DC | PRN

## 2017-07-17 MED ORDER — COCONUT OIL OIL: 1.0000 "application " | TOPICAL_OIL | Status: DC | PRN

## 2017-07-17 MED ORDER — MUPIROCIN 2 % EX OINT
1.0000 "application " | TOPICAL_OINTMENT | Freq: Two times a day (BID) | CUTANEOUS | Status: DC
  Administered 2017-07-17: 1 via NASAL
  Filled 2017-07-17: qty 22

## 2017-07-17 MED ORDER — TETANUS-DIPHTH-ACELL PERTUSSIS 5-2.5-18.5 LF-MCG/0.5 IM SUSP: 0.5000 mL | Freq: Once | INTRAMUSCULAR | Status: DC

## 2017-07-17 MED ORDER — DIBUCAINE 1 % RE OINT: 1.0000 "application " | TOPICAL_OINTMENT | RECTAL | Status: DC | PRN

## 2017-07-17 MED ORDER — SODIUM CHLORIDE 0.9 % IV SOLN
14.0000 mL/h | INTRAVENOUS | Status: DC | PRN
  Administered 2017-07-17: 14 mL/h via EPIDURAL
  Filled 2017-07-17 (×5): qty 17

## 2017-07-17 NOTE — Progress Notes (Addendum)
LABOR PROGRESS NOTE  Alice Osborne is Alice 31 y.o. [id number] at [redacted]w[redacted]d  admitted for labor  Subjective: Comfortable with epidural; 2 doses ABX infused for GBS prophylaxis. AROM ; scant fluid clear. Positive MRSA PCR. Standing orders initiated  Objective: BP 131/89   Pulse 67   Temp 97.8 F (36.6 C) (Oral)   Resp 18   Ht 5\' 3"  (1.6 m)   Wt 197 lb (89.4 kg)   LMP 10/21/2016   SpO2 100%   BMI 34.90 kg/m  or  Vitals:   07/17/17 0201 07/17/17 0231 07/17/17 0301 07/17/17 0331  BP: 129/89 (!) 136/94 (!) 126/107 131/89  Pulse: 63 82 (!) 113 67  Resp: 18 20 18 18   Temp:      TempSrc:      SpO2:      Weight:      Height:         Dilation: 10 Dilation Complete Date: 07/17/17 Dilation Complete Time: 0255 Effacement (%): 100 Cervical Position: Posterior Station: +1, +2 Presentation: Vertex Exam by:: Mammie Russian, RN  Labs: Lab Results  Component Value Date   WBC 6.0 07/16/2017   HGB 11.5 (L) 07/16/2017   HCT 32.3 (L) 07/16/2017   MCV 89.7 07/16/2017   PLT 155 07/16/2017    Patient Active Problem List   Diagnosis Date Noted  . Indication for care in labor or delivery 07/16/2017  . Positive GBS test 07/16/2017  . Blood type, Rh negative 07/16/2017  . Gestational hypertension 07/16/2017  . Right leg swelling 03/06/2015    Assessment / Plan: 31 y.o. [id number] at [redacted]w[redacted]d here for labor  Labor: Stage 2 Fetal Wellbeing:  Category 1 Pain Control:  Epidural Anticipated MOD:  SVD  Alice Osborne Alice Osborne CNM 07/17/2017, 4:00 AM

## 2017-07-17 NOTE — Lactation Note (Signed)
This note was copied from a baby's chart. Lactation Consultation Note  Patient Name: Girl Malita Ignasiak [id number] Date: 07/17/2017 Reason for consult: Initial assessment Breastfeeding resources given to patient.  This is mom's second baby and she breastfed her first for four months.  Newborn is 4 hours old.  Mom states baby just finished a good feeding.  Instructed to feed with any feeding cue using good waking techniques.  Encouraged to call for assist/concerns prn.  Maternal Data Does the patient have breastfeeding experience prior to this delivery?: Yes  Feeding    LATCH Score                   Interventions    Lactation Tools Discussed/Used     Consult Status Consult Status: Follow-up Date: 07/18/17 Follow-up type: In-patient    Ave Filter 07/17/2017, 9:06 AM

## 2017-07-17 NOTE — Anesthesia Postprocedure Evaluation (Signed)
Anesthesia Post Note  Patient: Alice Osborne  Procedure(s) Performed: * No procedures listed *     Patient location during evaluation: Mother Baby Anesthesia Type: Epidural Level of consciousness: awake and alert and oriented Pain management: satisfactory to patient Vital Signs Assessment: post-procedure vital signs reviewed and stable Respiratory status: spontaneous breathing and nonlabored ventilation Cardiovascular status: stable Postop Assessment: no headache, no backache, no signs of nausea or vomiting, adequate PO intake and patient able to bend at knees (patient up walking) Anesthetic complications: no    Last Vitals:  Vitals:   07/17/17 0800 07/17/17 0900  BP: 132/81 (!) 136/92  Pulse: 88 73  Resp:    Temp: 36.6 C 36.6 C    Last Pain:  Vitals:   07/17/17 1145  TempSrc:   PainSc: Asleep   Pain Goal: Patients Stated Pain Goal: 0 (07/16/17 1836)               Willa Rough

## 2017-07-18 ENCOUNTER — Ambulatory Visit: Payer: Self-pay

## 2017-07-18 ENCOUNTER — Encounter (HOSPITAL_COMMUNITY): Payer: Self-pay | Admitting: *Deleted

## 2017-07-18 MED ORDER — OXYCODONE HCL 5 MG PO TABS: 5.0000 mg | ORAL_TABLET | ORAL | 0 refills | Status: AC | PRN

## 2017-07-18 MED ORDER — OXYCODONE HCL 5 MG PO TABS
5.0000 mg | ORAL_TABLET | ORAL | Status: DC | PRN
  Administered 2017-07-18: 5 mg via ORAL
  Filled 2017-07-18: qty 1

## 2017-07-18 MED ORDER — RHO D IMMUNE GLOBULIN 1500 UNIT/2ML IJ SOSY
300.0000 ug | PREFILLED_SYRINGE | Freq: Once | INTRAMUSCULAR | Status: AC
  Administered 2017-07-18: 300 ug via INTRAMUSCULAR
  Filled 2017-07-18: qty 2

## 2017-07-18 MED ORDER — IBUPROFEN 600 MG PO TABS: 600.0000 mg | ORAL_TABLET | Freq: Four times a day (QID) | ORAL | 0 refills | Status: DC

## 2017-07-18 MED ORDER — OXYCODONE HCL 5 MG PO TABS: 5.0000 mg | ORAL_TABLET | ORAL | 0 refills | Status: DC | PRN

## 2017-07-18 MED ORDER — OXYCODONE HCL 5 MG PO TABS: 5.0000 mg | ORAL_TABLET | ORAL | Status: DC | PRN

## 2017-07-18 MED ORDER — DOCUSATE SODIUM 100 MG PO CAPS: 100.0000 mg | ORAL_CAPSULE | Freq: Two times a day (BID) | ORAL | 0 refills | Status: AC

## 2017-07-18 NOTE — Progress Notes (Signed)
Postpartum Note Day # 1  S:  Patient resting comfortable in bed.  Pain controlled.  Tolerating general diet. + flatus, no BM.  Lochia appropriate.  Ambulating without difficulty.  She denies n/v/f/c, SOB, or CP.  No headache, no blurry vision, no RUQ pain.  Pt plans on breastfeeding.  O: Temp:  [97.4 F (36.3 C)-98.3 F (36.8 C)] 98.3 F (36.8 C) (08/04 0550) Pulse Rate:  [63-90] 72 (08/04 0550) Resp:  [16-20] 18 (08/04 0550) BP: (129-136)/(81-96) 130/88 (08/04 0550) SpO2:  [98 %-100 %] 98 % (08/03 1300)   Gen: A&Ox3, NAD CV: RRR, no MRG Resp: CTAB Abdomen: soft, NT, ND Uterus: firm, non-tender, below umbilicus Incision: c/d/i, bandage on Ext: No edema, no calf tenderness bilaterally, SCDs in place  Labs:  CBC Latest Ref Rng & Units 07/16/2017 06/21/2017 08/23/2016  WBC 4.0 - 10.5 K/uL 6.0 6.1 6.1  Hemoglobin 12.0 - 15.0 g/dL 11.5(L) 10.8(L) 10.9(L)  Hematocrit 36.0 - 46.0 % 32.3(L) 30.2(L) 31.4(L)  Platelets 150 - 400 K/uL 155 165 189    A/P: Pt is a 31 y.o. [id number] s/p NSVD, PPD#1  -gestational HTN: BP within normal limits, pt asymptomatic, no BP medication indicated - Pain well controlled -GU: voiding freely -GI: Tolerating general diet -Activity: encouraged sitting up to chair and ambulation as tolerated -Prophylaxis: early ambulation -Labs: stable as above  DISPO: meeting postpartum milestones appropriately.  Considering early discharge, pending PEDS.  F/U in 1 wk for BP check  Janyth Pupa, DO [phone] (pager) [phone] (office)

## 2017-07-18 NOTE — Discharge Instructions (Signed)

## 2017-07-18 NOTE — Discharge Summary (Signed)
OB Discharge Summary     Patient Name: Dameka Younker DOB: Mar 05, 1986 MRN: [id number]  Date of admission: 07/16/2017 Delivering MD: Yvonne Kendall A   Date of discharge: 07/18/2017  Admitting diagnosis: 82WKS CTX Intrauterine pregnancy: [redacted]w[redacted]d     Secondary diagnosis:  Active Problems:   Indication for care in labor or delivery   Positive GBS test   Blood type, Rh negative   Gestational hypertension  Additional problems: none     Discharge diagnosis: Term Pregnancy Delivered                                                                                                Post partum procedures:rhogam  Augmentation: none  Complications: None  Hospital course:  Onset of Labor With Vaginal Delivery     31 y.o. yo [id number] at [redacted]w[redacted]d was admitted in Latent Labor on 07/16/2017. Patient had an uncomplicated labor course as follows:  Membrane Rupture Time/Date: 3:53 AM ,07/17/2017   Intrapartum Procedures: Episiotomy: None [1]                                         Lacerations:  None [1]  Patient had a delivery of a Viable infant. 07/17/2017  Information for the patient's newborn:  Lama, Narayanan [[id number]]       Pateint had an uncomplicated postpartum course.  She is ambulating, tolerating a regular diet, passing flatus, and urinating well. Patient is discharged home in stable condition on 07/18/17.   Physical exam  Vitals:   07/17/17 1300 07/17/17 1330 07/17/17 2100 07/18/17 0550  BP: (!) 135/94 (!) 129/96 130/86 130/88  Pulse: 63 70 72 72  Resp: 18 18 20 18   Temp: (!) 97.4 F (36.3 C)  98 F (36.7 C) 98.3 F (36.8 C)  TempSrc: Oral  Oral Oral  SpO2: 98%     Weight:      Height:       General: alert, cooperative and no distress Lochia: appropriate Uterine Fundus: firm Incision: N/A DVT Evaluation: No evidence of DVT seen on physical exam. Labs: Lab Results  Component Value Date   WBC 6.0 07/16/2017   HGB 11.5 (L) 07/16/2017   HCT 32.3 (L) 07/16/2017   MCV 89.7  07/16/2017   PLT 155 07/16/2017   CMP Latest Ref Rng & Units 07/16/2017  Glucose 65 - 99 mg/dL 100(H)  BUN 6 - 20 mg/dL 13  Creatinine 0.44 - 1.00 mg/dL 0.45  Sodium 135 - 145 mmol/L 135  Potassium 3.5 - 5.1 mmol/L 3.9  Chloride 101 - 111 mmol/L 106  CO2 22 - 32 mmol/L 21(L)  Calcium 8.9 - 10.3 mg/dL 9.5  Total Protein 6.5 - 8.1 g/dL 7.2  Total Bilirubin 0.3 - 1.2 mg/dL 0.5  Alkaline Phos 38 - 126 U/L 132(H)  AST 15 - 41 U/L 30  ALT 14 - 54 U/L 18    Discharge instruction: per After Visit Summary and "Baby and Me Booklet".  After visit meds:  Allergies as of  07/18/2017   No Known Allergies     Medication List    STOP taking these medications   acetaminophen 500 MG tablet Commonly known as:  TYLENOL   aspirin 81 MG chewable tablet   butalbital-acetaminophen-caffeine 50-325-40 MG tablet Commonly known as:  FIORICET   promethazine 12.5 MG tablet Commonly known as:  PHENERGAN     TAKE these medications   docusate sodium 100 MG capsule Commonly known as:  COLACE Take 1 capsule (100 mg total) by mouth 2 (two) times daily.   ibuprofen 600 MG tablet Commonly known as:  ADVIL,MOTRIN Take 1 tablet (600 mg total) by mouth every 6 (six) hours.   IRON PO Take 1 tablet by mouth daily.   oxyCODONE 5 MG immediate release tablet Commonly known as:  Oxy IR/ROXICODONE Take 1 tablet (5 mg total) by mouth every 4 (four) hours as needed for moderate pain.   prenatal multivitamin Tabs tablet Take 1 tablet by mouth daily.       Diet: routine diet  Activity: Advance as tolerated. Pelvic rest for 6 weeks.   Outpatient follow up:6 weeks Follow up Appt:No future appointments. Follow up Visit:No Follow-up on file.  Postpartum contraception: Not Discussed  Newborn Data: Live born female  Birth Weight: 6 lb 13.3 oz (3099 g) APGAR: 9, 9  Baby Feeding: Breast Disposition:home with mother   07/18/2017 Janyth Pupa, M, DO

## 2017-07-18 NOTE — Lactation Note (Signed)
This note was copied from a baby's chart. Lactation Consultation Note  Patient Name: Alice Osborne [id number] Date: 07/18/2017 Reason for consult: Initial assessment;Infant weight loss (early D/C, 3% weight loss, exp breast feeding mother )  Randel Books is 73 hours old.  LC reviewed and updated the doc flow sheets per mom.  Mom mentioned breast feeding is going well and denies soreness.  Per mom did not experience soreness or engorgement with her 1st baby Sore nipple and engorgement prevention and tx reviewed.  Per mom feels comfortable with hand expressing and declined offer for hand pump.  LC  Reviewed nutritive vs non - nutritive feeding patterns and to watch out for hanging out at the breast.  Mother informed of post-discharge support and given phone number to the lactation department, including services for phone call assistance; out-patient appointments; and breastfeeding support group. List of other breastfeeding resources in the community given in the handout. Encouraged mother to call for problems or concerns related to breastfeeding.   Maternal Data Has patient been taught Hand Expression?:  (per  mom feel comfortable hand expressing and don't feel I need a hand pump )  Feeding Feeding Type:  (per mom baby last fed at 1220 for 25 mins ) Length of feed: 25 min (per mom )  LATCH Score                   Interventions Interventions: Breast feeding basics reviewed  Lactation Tools Discussed/Used WIC Program: No   Consult Status Consult Status: Complete Date: 07/18/17    Myer Haff 07/18/2017, 2:51 PM

## 2017-07-19 LAB — RH IG WORKUP (INCLUDES ABO/RH)
ABO/RH(D): A NEG
FETAL SCREEN: NEGATIVE
GESTATIONAL AGE(WKS): 38
Unit division: 0

## 2017-07-21 ENCOUNTER — Inpatient Hospital Stay (HOSPITAL_COMMUNITY): Admission: RE | Admit: 2017-07-21 | Payer: BLUE CROSS/BLUE SHIELD | Source: Ambulatory Visit

## 2017-12-10 IMAGING — US US OB TRANSVAGINAL
1 series · 15 of 28 positions shown · non-contrast
Comparison: None.

CLINICAL DATA: 31-year-old female with vaginal bleeding and pelvic
cramping.

EXAM:
OBSTETRIC <14 WK US AND TRANSVAGINAL OB US
TECHNIQUE: Both transabdominal and transvaginal ultrasound examinations were
performed for complete evaluation of the gestation as well as the
maternal uterus, adnexal regions, and pelvic cul-de-sac.
Transvaginal technique was performed to assess early pregnancy.

[Series 1: us ob transvaginal · 41 acquisitions, 15 frames shown]
[im 1/41]
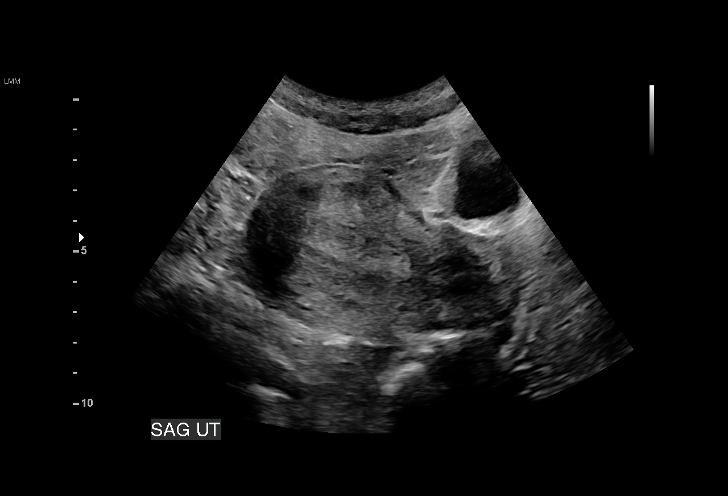
[im 3/41]
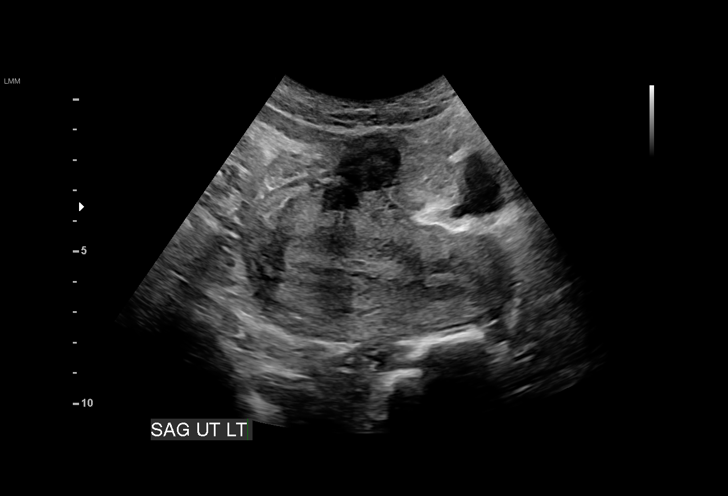
[im 6/41]
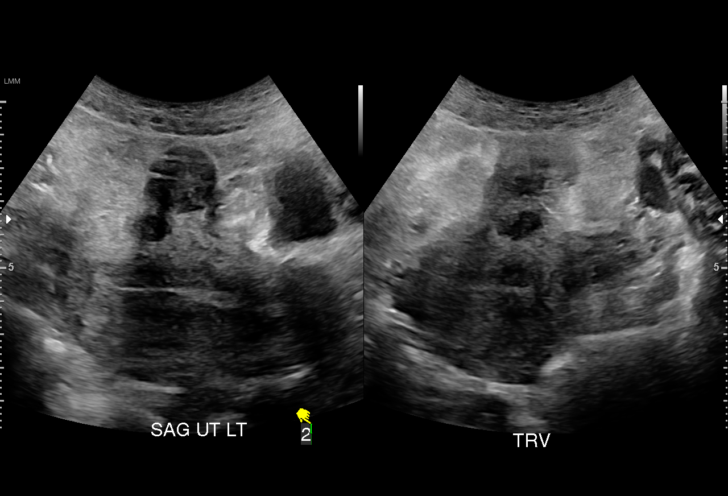
[im 9/41]
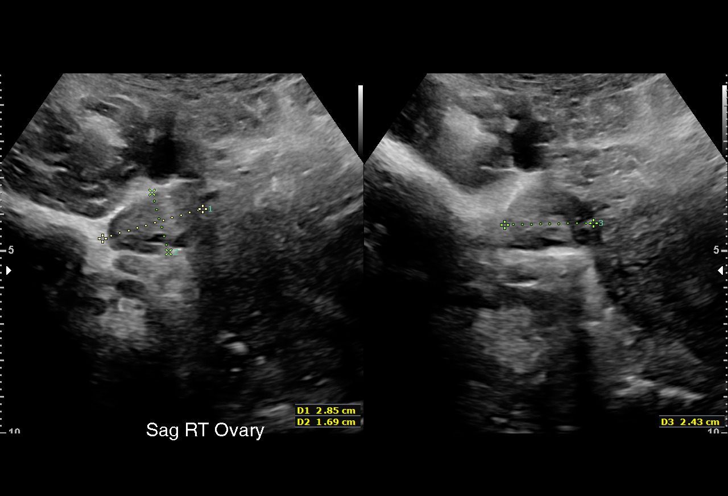
[im 12/41]
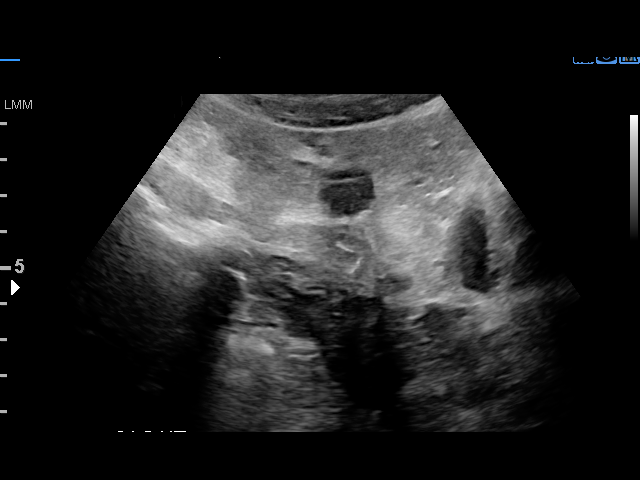
[im 15/41]
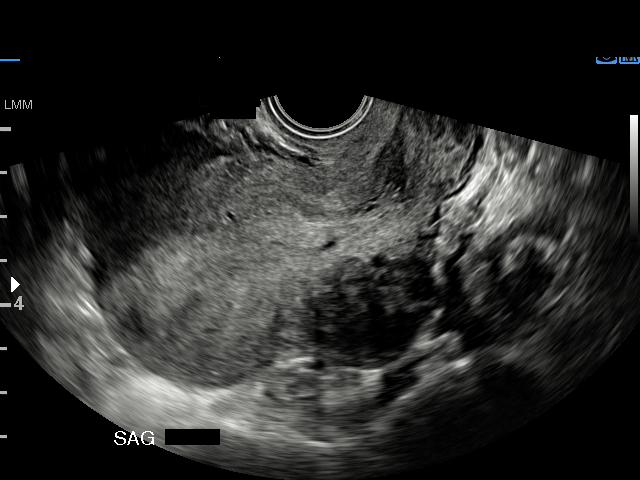
[im 18/41]
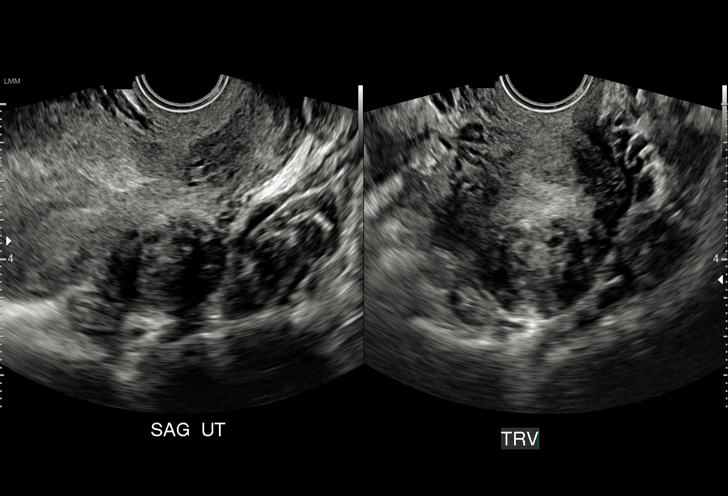
[im 21/41]
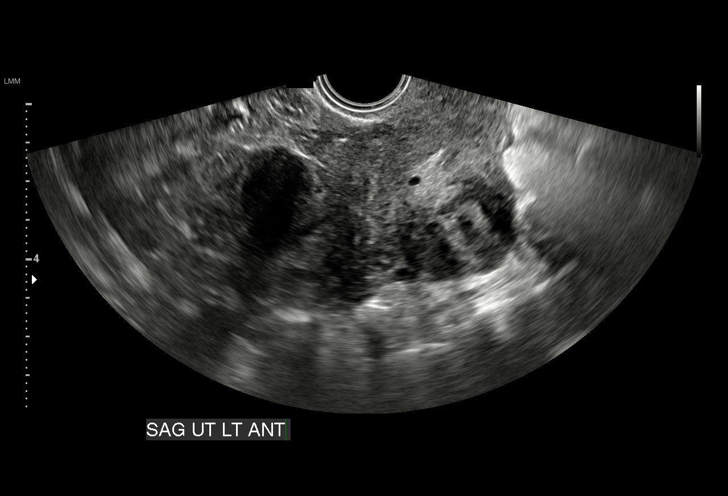
[im 23/41]
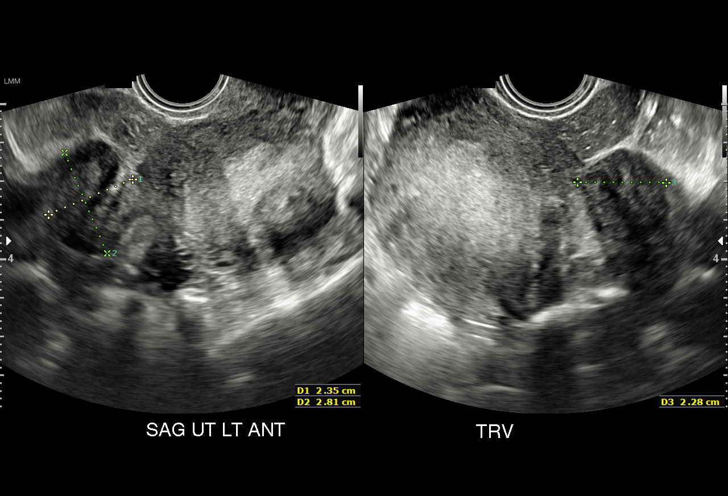
[im 26/41]
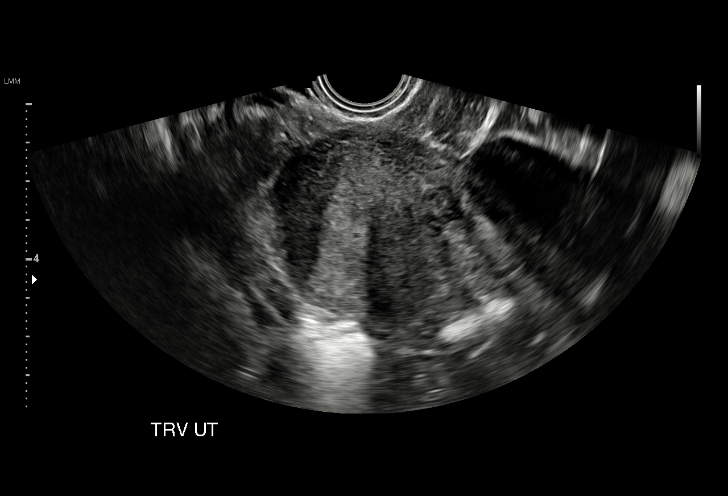
[im 29/41]
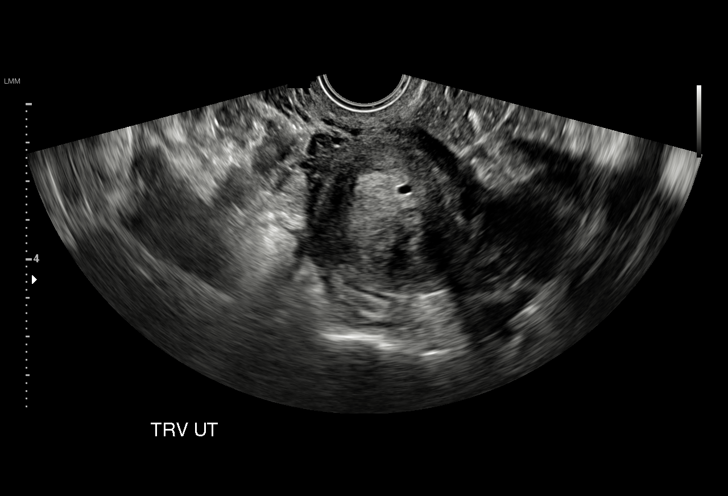
[im 32/41]
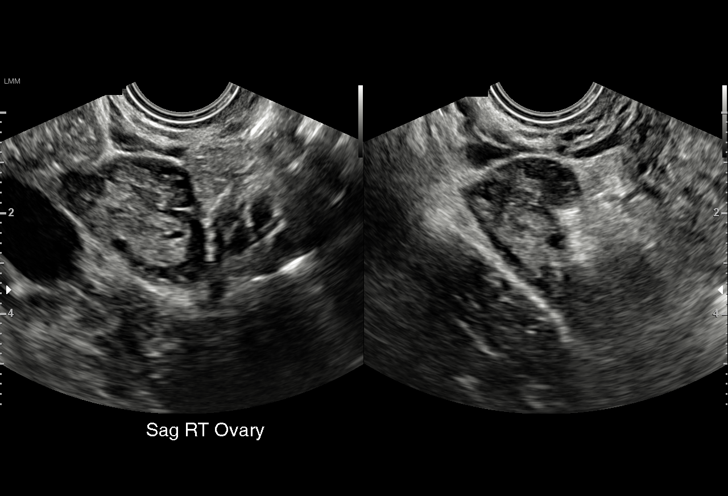
[im 35/41]
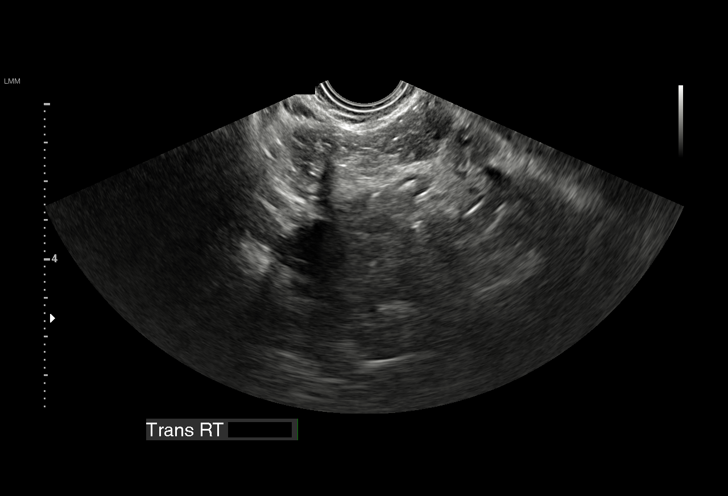
[im 38/41]
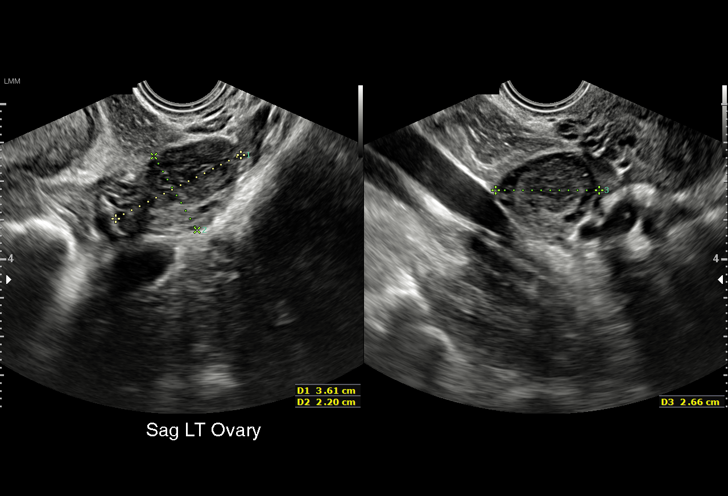
[im 41/41]
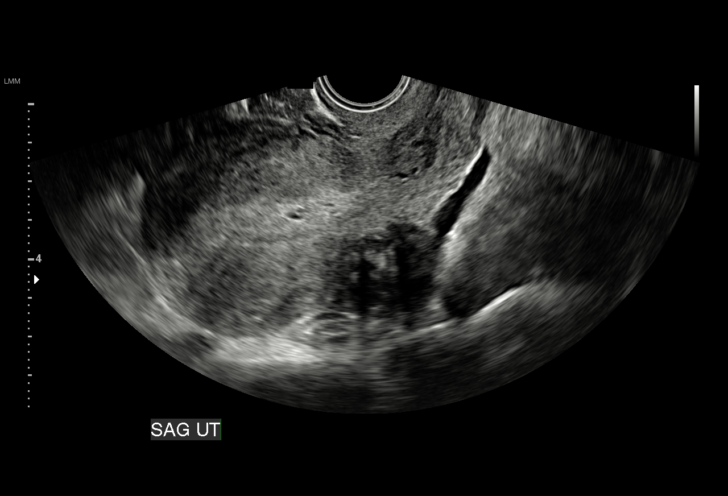

[15 of 28 positions shown; findings below may reference images not displayed]

FINDINGS: The uterus is anteverted and heterogeneous. Multiple fibroids noted.
A posterior uterine sub serosal fibroid measures 3.3 x 2.7 x 3.2 cm.
A 2.4 x 2.8 x 2.3 cm anterior body exophytic fibroid noted.

The endometrium is unremarkable. No intrauterine pregnancy
identified. Correlation with clinical exam and HCG levels
recommended. Please note with a positive HCG level and in the
absence of documented intrauterine pregnancy ultrasound, the
possibility of an ectopic pregnancy is not excluded. Correlation
with serial HCG levels and follow-up with ultrasound recommended.

The ovaries appear unremarkable. The right ovary measures 3.4 x
x 2.3 cm and the left ovary measures 3.6 x 2.2 x 2.7 cm.

Trace free fluid noted within the pelvis.
IMPRESSION: No intrauterine pregnancy identified. Correlation with clinical exam
and follow-up with serial HCG levels and ultrasound recommended.

Multiple uterine fibroids.

Unremarkable ovaries.

## 2023-09-06 ENCOUNTER — Emergency Department (HOSPITAL_BASED_OUTPATIENT_CLINIC_OR_DEPARTMENT_OTHER)
Admission: EM | Admit: 2023-09-06 | Discharge: 2023-09-06 | Disposition: A | Discharge status: Home / Self Care | Payer: 59 | Attending: Emergency Medicine | Admitting: Emergency Medicine

## 2023-09-06 ENCOUNTER — Encounter (HOSPITAL_BASED_OUTPATIENT_CLINIC_OR_DEPARTMENT_OTHER): Payer: Self-pay | Admitting: Emergency Medicine

## 2023-09-06 ENCOUNTER — Emergency Department (HOSPITAL_BASED_OUTPATIENT_CLINIC_OR_DEPARTMENT_OTHER): Payer: 59

## 2023-09-06 DIAGNOSIS — N939 Abnormal uterine and vaginal bleeding, unspecified: Secondary | ICD-10-CM

## 2023-09-06 DIAGNOSIS — D259 Leiomyoma of uterus, unspecified: Secondary | ICD-10-CM | POA: Diagnosis not present

## 2023-09-06 LAB — COMPREHENSIVE METABOLIC PANEL
ALT: 12 U/L (ref 0–44)
AST: 15 U/L (ref 15–41)
Albumin: 3.5 g/dL (ref 3.5–5.0)
Alkaline Phosphatase: 38 U/L (ref 38–126)
Anion gap: 8 (ref 5–15)
BUN: 16 mg/dL (ref 6–20)
CO2: 22 mmol/L (ref 22–32)
Calcium: 8.3 mg/dL — ABNORMAL LOW (ref 8.9–10.3)
Chloride: 103 mmol/L (ref 98–111)
Creatinine, Ser: 0.57 mg/dL (ref 0.44–1.00)
GFR, Estimated: >60 mL/min (ref >60)
Glucose, Bld: 92 mg/dL (ref 70–99)
Potassium: 3.5 mmol/L (ref 3.5–5.1)
Sodium: 133 mmol/L — ABNORMAL LOW (ref 135–145)
Total Bilirubin: 0.2 mg/dL — ABNORMAL LOW (ref 0.3–1.2)
Total Protein: 6.8 g/dL (ref 6.5–8.1)

## 2023-09-06 LAB — CBC WITH DIFFERENTIAL/PLATELET
Abs Immature Granulocytes: 0 10*3/uL (ref 0.00–0.07)
Basophils Absolute: 0 10*3/uL (ref 0.0–0.1)
Basophils Relative: 1 %
Eosinophils Absolute: 0.1 10*3/uL (ref 0.0–0.5)
Eosinophils Relative: 2 %
HCT: 27 % — ABNORMAL LOW (ref 36.0–46.0)
Hemoglobin: 8.8 g/dL — ABNORMAL LOW (ref 12.0–15.0)
Immature Granulocytes: 0 %
Lymphocytes Relative: 44 %
Lymphs Abs: 1.8 10*3/uL (ref 0.7–4.0)
MCH: 28.7 pg (ref 26.0–34.0)
MCHC: 32.6 g/dL (ref 30.0–36.0)
MCV: 87.9 fL (ref 80.0–100.0)
Monocytes Absolute: 0.4 10*3/uL (ref 0.1–1.0)
Monocytes Relative: 10 %
Neutro Abs: 1.8 10*3/uL (ref 1.7–7.7)
Neutrophils Relative %: 43 %
Platelets: 219 10*3/uL (ref 150–400)
RBC: 3.07 MIL/uL — ABNORMAL LOW (ref 3.87–5.11)
RDW: 13.1 % (ref 11.5–15.5)
WBC: 4.1 10*3/uL (ref 4.0–10.5)
nRBC: 0 % (ref 0.0–0.2)

## 2023-09-06 LAB — LIPASE, BLOOD: Lipase: 30 U/L (ref 11–51)

## 2023-09-06 LAB — HCG, SERUM, QUALITATIVE: Preg, Serum: NEGATIVE

## 2023-09-06 LAB — URINALYSIS, MICROSCOPIC (REFLEX)

## 2023-09-06 LAB — URINALYSIS, ROUTINE W REFLEX MICROSCOPIC
Bilirubin Urine: NEGATIVE
Glucose, UA: NEGATIVE mg/dL
Ketones, ur: NEGATIVE mg/dL
Leukocytes,Ua: NEGATIVE
Nitrite: NEGATIVE
Protein, ur: NEGATIVE mg/dL
Specific Gravity, Urine: 1.025 (ref 1.005–1.030)
pH: 7 (ref 5.0–8.0)

## 2023-09-06 LAB — PROTIME-INR
INR: 1 (ref 0.8–1.2)
Prothrombin Time: 13.4 seconds (ref 11.4–15.2)

## 2023-09-06 MED ORDER — SODIUM CHLORIDE 0.9 % IV BOLUS
1000.0000 mL | Freq: Once | INTRAVENOUS | Status: AC
  Administered 2023-09-06: 1000 mL via INTRAVENOUS

## 2023-09-06 MED ORDER — NAPROXEN SODIUM 220 MG PO TABS: 220.0000 mg | ORAL_TABLET | Freq: Two times a day (BID) | ORAL | 0 refills | Status: AC

## 2023-09-06 NOTE — ED Notes (Signed)
Pt going to [place]

## 2023-09-06 NOTE — ED Provider Notes (Addendum)
[place] [facility] AT [facility] Provider Note  CSN: [id number] Arrival date & time: 09/06/23 1055  Chief Complaint(s) Vaginal Bleeding  HPI Alice Osborne is a 37 y.o. female with past medical history as below, significant for fibroids, anemia, GERD, Rh- who presents to the ED with complaint of abnormal vaginal bleeding  Patient reports that she is currently menstruating, her menstrual period usually last between 7 to 8 days, she is on day 9 of her menstrual cycle.  Still having a heavy flow and passing clots which is mildly worsened from her typical menses.  No significant abdominal pain, having some pelvic cramping associate with her typical menstrual cycle.  No dysuria, no BRBPR or melena, no change to bowel or bladder function.  She felt lightheaded earlier today when standing up but resolved within a few moments.  No chest pain or dyspnea, no nausea or vomiting.  Past Medical History Past Medical History:  Diagnosis Date   Anemia    Fibroid    GERD (gastroesophageal reflux disease)    Headache    Pregnancy induced hypertension    Patient Active Problem List   Diagnosis Date Noted   Indication for care in labor or delivery 07/16/2017   Positive GBS test 07/16/2017   Blood type, Rh negative 07/16/2017   Gestational hypertension 07/16/2017   Right leg swelling 03/06/2015   Home Medication(s) Prior to Admission medications   Medication Sig Start Date End Date Taking? Authorizing Provider  naproxen sodium (ALEVE) 220 MG tablet Take 1 tablet (220 mg total) by mouth 2 (two) times daily with a meal for 7 days. 09/06/23 09/13/23 Yes Sloan Leiter, DO  docusate sodium (COLACE) 100 MG capsule Take 1 capsule (100 mg total) by mouth 2 (two) times daily. 07/18/17   Myna Hidalgo, DO  IRON PO Take 1 tablet by mouth daily.    [provider]  oxyCODONE (OXY IR/ROXICODONE) 5 MG immediate release tablet Take 1 tablet (5 mg total) by mouth every 4 (four) hours  as needed for moderate pain. 07/18/17   Myna Hidalgo, DO  Prenatal Vit-Fe Fumarate-FA (PRENATAL MULTIVITAMIN) TABS Take 1 tablet by mouth daily.     [provider]                                                                                                                                    Past Surgical History Past Surgical History:  Procedure Laterality Date   NO PAST SURGERIES     Family History Family History  Problem Relation Age of Onset   Hyperlipidemia Mother    Hypertension Mother    Varicose Veins Mother     Social History Social History   Tobacco Use   Smoking status: Never   Smokeless tobacco: Never  Substance Use Topics   Alcohol use: No   Drug use: No   Allergies Patient has no known allergies.  Review  of Systems Review of Systems  Constitutional:  Negative for chills, fatigue and fever.  Genitourinary:  Positive for menstrual problem, pelvic pain and vaginal bleeding. Negative for dysuria.  Neurological:  Positive for light-headedness.  All other systems reviewed and are negative.   Physical Exam Vital Signs  I have reviewed the triage vital signs BP 130/85   Pulse 71   Temp 98.5 F (36.9 C) (Oral)   Resp 16   Ht 5\' 4"  (1.626 m)   Wt 77.6 kg   LMP 08/29/2023   SpO2 100%   Breastfeeding No Comment: on BC pills  BMI 29.35 kg/m  Physical Exam Vitals and nursing note reviewed.  Constitutional:      General: She is not in acute distress.    Appearance: Normal appearance. She is well-developed. She is not ill-appearing.  HENT:     Head: Normocephalic and atraumatic.     Right Ear: External ear normal.     Left Ear: External ear normal.     Nose: Nose normal.     Mouth/Throat:     Mouth: Mucous membranes are moist.  Eyes:     General: No scleral icterus.       Right eye: No discharge.        Left eye: No discharge.  Cardiovascular:     Rate and Rhythm: Normal rate.  Pulmonary:     Effort: Pulmonary effort is normal. No  respiratory distress.     Breath sounds: No stridor.  Abdominal:     General: Abdomen is flat. There is no distension.     Palpations: Abdomen is soft.     Tenderness: There is no guarding.  Musculoskeletal:        General: No deformity.     Cervical back: No rigidity.  Skin:    General: Skin is warm and dry.     Coloration: Skin is not cyanotic, jaundiced or pale.  Neurological:     Mental Status: She is alert and oriented to person, place, and time.     GCS: GCS eye subscore is 4. GCS verbal subscore is 5. GCS motor subscore is 6.  Psychiatric:        Speech: Speech normal.        Behavior: Behavior normal. Behavior is cooperative.     ED Results and Treatments Labs (all labs ordered are listed, but only abnormal results are displayed) Labs Reviewed  CBC WITH DIFFERENTIAL/PLATELET - Abnormal; Notable for the following components:      Result Value   RBC 3.07 (*)    Hemoglobin 8.8 (*)    HCT 27.0 (*)    All other components within normal limits  COMPREHENSIVE METABOLIC PANEL - Abnormal; Notable for the following components:   Sodium 133 (*)    Calcium 8.3 (*)    Total Bilirubin 0.2 (*)    All other components within normal limits  URINALYSIS, ROUTINE W REFLEX MICROSCOPIC - Abnormal; Notable for the following components:   Hgb urine dipstick MODERATE (*)    All other components within normal limits  URINALYSIS, MICROSCOPIC (REFLEX) - Abnormal; Notable for the following components:   Bacteria, UA RARE (*)    All other components within normal limits  PROTIME-INR  LIPASE, BLOOD  HCG, SERUM, QUALITATIVE  Radiology US Pelvis Complete  Result Date: 09/06/2023 CLINICAL DATA:  vaginal bleeding EXAM: TRANSABDOMINAL AND TRANSVAGINAL ULTRASOUND OF PELVIS TECHNIQUE: Both transabdominal and transvaginal ultrasound examinations of the pelvis were performed.  Transabdominal technique was performed for global imaging of the pelvis including uterus, ovaries, adnexal regions, and pelvic cul-de-sac. It was necessary to proceed with endovaginal exam following the transabdominal exam to visualize the LEFT ovary . COMPARISON:  None Available. FINDINGS: Uterus Measurements: 9 x 9.5 x 9.7 cm = volume: 434 mL. There are multiple fibroids throughout the uterus. The uterus is overall heterogeneous in appearance. There is a large subserosal fibroid of the inferior uterus which measures 8.0 x 5.9 x 8.1 cm. The uterus in the RIGHT mid body measures 5.2 x 3.7 x 3.9 cm and is favored to have a submucosal component with distortion of the endometrial canal. Endometrium Thickness: 8 mm transabdominally. Endometrium is challenging to visualize secondary to multiple fibroids and is better discretely visualized transabdominally given limited trans vaginal visualization by multiple fibroids. No discrete mass is visualized. Right ovary Not visualized transabdominally or transvaginally. Left ovary Measurements: 3.7 x 2.1 x 2.6 cm = volume: 10.5 mL. Normal appearance/no adnexal mass. Other findings No abnormal free fluid. IMPRESSION: 1. There are multiple fibroids throughout the uterus. The largest measures up to 8.1 cm. One fibroid distorts the endometrium and is favored to have a submucosal component. 2. Limited assessment of the endometrium secondary to multiple shadowing fibroids. Electronically Signed   By: Meda Klinefelter M.D.   On: 09/06/2023 14:49   US Transvaginal Non-OB  Result Date: 09/06/2023 CLINICAL DATA:  vaginal bleeding EXAM: TRANSABDOMINAL AND TRANSVAGINAL ULTRASOUND OF PELVIS TECHNIQUE: Both transabdominal and transvaginal ultrasound examinations of the pelvis were performed. Transabdominal technique was performed for global imaging of the pelvis including uterus, ovaries, adnexal regions, and pelvic cul-de-sac. It was necessary to proceed with endovaginal exam following  the transabdominal exam to visualize the LEFT ovary . COMPARISON:  None Available. FINDINGS: Uterus Measurements: 9 x 9.5 x 9.7 cm = volume: 434 mL. There are multiple fibroids throughout the uterus. The uterus is overall heterogeneous in appearance. There is a large subserosal fibroid of the inferior uterus which measures 8.0 x 5.9 x 8.1 cm. The uterus in the RIGHT mid body measures 5.2 x 3.7 x 3.9 cm and is favored to have a submucosal component with distortion of the endometrial canal. Endometrium Thickness: 8 mm transabdominally. Endometrium is challenging to visualize secondary to multiple fibroids and is better discretely visualized transabdominally given limited trans vaginal visualization by multiple fibroids. No discrete mass is visualized. Right ovary Not visualized transabdominally or transvaginally. Left ovary Measurements: 3.7 x 2.1 x 2.6 cm = volume: 10.5 mL. Normal appearance/no adnexal mass. Other findings No abnormal free fluid. IMPRESSION: 1. There are multiple fibroids throughout the uterus. The largest measures up to 8.1 cm. One fibroid distorts the endometrium and is favored to have a submucosal component. 2. Limited assessment of the endometrium secondary to multiple shadowing fibroids. Electronically Signed   By: Meda Klinefelter M.D.   On: 09/06/2023 14:49    Pertinent labs & imaging results that were available during my care of the patient were reviewed by me and considered in my medical decision making (see MDM for details).  Medications Ordered in ED Medications  sodium chloride 0.9 % bolus 1,000 mL (1,000 mLs Intravenous New Bag/Given 09/06/23 1250)  Procedures Procedures  (including critical care time)  Medical Decision Making / ED Course    Medical Decision Making:    Alice Osborne is a 37 y.o. female with past medical history as  below, significant for fibroids, anemia, GERD, Rh- who presents to the ED with complaint of abnormal vaginal bleeding. The complaint involves an extensive differential diagnosis and also carries with it a high risk of complications and morbidity.  Serious etiology was considered. Ddx includes but is not limited to: Abnormal uterine bleeding, abnormal menses, fibroid, pregnancy, leiomyoma, polyp, endocrine disturbance, etc.  Complete initial physical exam performed, notably the patient  was no acute distress, HDS, abdomen benign.    Reviewed and confirmed nursing documentation for past medical history, family history, social history.  Vital signs reviewed.    Clinical Course as of 09/06/23 1503  Sun Sep 06, 2023  1452 Ultrasound with multiple fibroids  [SG]  1454 Hemoglobin(!): 8.8 Mildly reduced from baseline [SG]  1454 RBC / HPF: 11-20 She is currently menstruating [SG]    Clinical Course User Index [SG] Sloan Leiter, DO     Labs stable as above  U/s concerning for multiple fibroids (largest 8.1 cm), she has known hx fibroids  She is hemodynamically stable, anemia is mild.  Does not require blood transfusion at this time.  Fever vaginal bleeding likely secondary to ongoing menstruation and leiomyoma  Recommend she follow-up with OB/GYN for further evaluation.  Will add Aleve twice daily   The patient improved significantly and was discharged in stable condition. Detailed discussions were had with the patient regarding current findings, and need for close f/u with PCP or on call doctor. The patient has been instructed to return immediately if the symptoms worsen in any way for re-evaluation. Patient verbalized understanding and is in agreement with current care plan. All questions answered prior to discharge.                  Additional history obtained: -Additional history obtained from na -External records from outside source obtained and reviewed including: Chart  review including previous notes, labs, imaging, consultation notes including  Home medications Primary care documentation   Lab Tests: -I ordered, reviewed, and interpreted labs.   The pertinent results include:   Labs Reviewed  CBC WITH DIFFERENTIAL/PLATELET - Abnormal; Notable for the following components:      Result Value   RBC 3.07 (*)    Hemoglobin 8.8 (*)    HCT 27.0 (*)    All other components within normal limits  COMPREHENSIVE METABOLIC PANEL - Abnormal; Notable for the following components:   Sodium 133 (*)    Calcium 8.3 (*)    Total Bilirubin 0.2 (*)    All other components within normal limits  URINALYSIS, ROUTINE W REFLEX MICROSCOPIC - Abnormal; Notable for the following components:   Hgb urine dipstick MODERATE (*)    All other components within normal limits  URINALYSIS, MICROSCOPIC (REFLEX) - Abnormal; Notable for the following components:   Bacteria, UA RARE (*)    All other components within normal limits  PROTIME-INR  LIPASE, BLOOD  HCG, SERUM, QUALITATIVE    Notable for as above  EKG   EKG Interpretation Date/Time:    Ventricular Rate:    PR Interval:    QRS Duration:    QT Interval:    QTC Calculation:   R Axis:      Text Interpretation:           Imaging Studies  ordered: I ordered imaging studies including US pelvis I independently visualized the following imaging with scope of interpretation limited to determining acute life threatening conditions related to emergency care; findings noted above, significant for fibroids  I independently visualized and interpreted imaging. I agree with the radiologist interpretation   Medicines ordered and prescription drug management: Meds ordered this encounter  Medications   sodium chloride 0.9 % bolus 1,000 mL   naproxen sodium (ALEVE) 220 MG tablet    Sig: Take 1 tablet (220 mg total) by mouth 2 (two) times daily with a meal for 7 days.    Dispense:  14 tablet    Refill:  0    -I have  reviewed the patients home medicines and have made adjustments as needed   Consultations Obtained: na   Cardiac Monitoring: Continuous pulse oximetry interpreted by myself, 99% on RA.    Social Determinants of Health:  Diagnosis or treatment significantly limited by social determinants of health: na   Reevaluation: After the interventions noted above, I reevaluated the patient and found that they have improved  Co morbidities that complicate the patient evaluation  Past Medical History:  Diagnosis Date   Anemia    Fibroid    GERD (gastroesophageal reflux disease)    Headache    Pregnancy induced hypertension       Dispostion: Disposition decision including need for hospitalization was considered, and patient discharged from emergency department.    Final Clinical Impression(s) / ED Diagnoses Final diagnoses:  Abnormal uterine bleeding (AUB)  Uterine leiomyoma, unspecified location        Sloan Leiter, DO 09/06/23 1502    Sloan Leiter, DO 09/06/23 1503

## 2023-09-06 NOTE — Discharge Instructions (Addendum)
Your labs today were reassuring, your hemoglobin is mildly reduced likely secondary to ongoing vaginal bleeding.  This is likely secondary to fibroids.  Please follow-up with OB/GYN for further evaluation of fibroids, may benefit from adjustment to your contraceptive/hormonal medication or potential surgical evaluation if symptoms persist.  It was a pleasure caring for you today in the emergency department.  Please return to the emergency department for any worsening or worrisome symptoms.

## 2023-09-06 NOTE — ED Triage Notes (Signed)
Pt sts her period is longer and heavier this month; she is on day 9 and is passing lots of clots; denies pain other than normal cramping

## 2023-09-18 ENCOUNTER — Other Ambulatory Visit: Payer: Self-pay | Admitting: Interventional Radiology

## 2023-09-18 DIAGNOSIS — D259 Leiomyoma of uterus, unspecified: Secondary | ICD-10-CM

## 2023-10-01 ENCOUNTER — Other Ambulatory Visit: Payer: Self-pay | Admitting: Obstetrics and Gynecology

## 2023-10-01 DIAGNOSIS — D259 Leiomyoma of uterus, unspecified: Secondary | ICD-10-CM

## 2023-10-05 ENCOUNTER — Ambulatory Visit
Admission: RE | Admit: 2023-10-05 | Discharge: 2023-10-05 | Disposition: A | Discharge status: Home / Self Care | Payer: 59 | Source: Ambulatory Visit | Attending: Interventional Radiology

## 2023-10-05 DIAGNOSIS — D259 Leiomyoma of uterus, unspecified: Secondary | ICD-10-CM

## 2023-10-05 MED ORDER — GADOPICLENOL 0.5 MMOL/ML IV SOLN
8.0000 mL | Freq: Once | INTRAVENOUS | Status: AC | PRN
  Administered 2023-10-05: 8 mL via INTRAVENOUS

## 2023-10-20 ENCOUNTER — Ambulatory Visit
Admission: RE | Admit: 2023-10-20 | Discharge: 2023-10-20 | Disposition: A | Discharge status: Home / Self Care | Payer: 59 | Source: Ambulatory Visit | Attending: Obstetrics and Gynecology

## 2023-10-20 DIAGNOSIS — D259 Leiomyoma of uterus, unspecified: Secondary | ICD-10-CM

## 2023-10-20 HISTORY — PX: IR RADIOLOGIST EVAL & MGMT: IMG5224

## 2023-10-21 NOTE — Consult Note (Signed)
Chief Complaint: Patient was seen in consultation today for uterine fibroids at the request of Davies,Melissa  Referring Physician(s): Davies,Melissa  History of Present Illness: Alice Osborne is a 37 y.o. female With a history of highly symptomatic uterine fibroids.  She was diagnosed with fibroids approximately 9 years ago.  Recently, her menstrual cycles have become extremely heavy.  They occur every 28 days and last between 5 and 12 days.  She has very heavy bleeding with quarter size blood clots.  She was found to be anemic with a hemoglobin of 8.8.  This results in fatigue consistent with symptomatic anemia.  Pap smear was last performed in 2021 and was negative.  She is monogamous and married.  MRI demonstrates multiple uterine fibroids.  No evidence of adenomyosis or pedunculated fibroid.  She denies significant cramping, dyspareunia or bulk related symptoms.  She is currently on hormone suppression which has helped with the bleeding.  However, this is not an effective long-term strategy at her relatively young age.  Past Medical History:  Diagnosis Date   Anemia    Fibroid    GERD (gastroesophageal reflux disease)    Headache    Pregnancy induced hypertension     Past Surgical History:  Procedure Laterality Date   IR RADIOLOGIST EVAL & MGMT  10/20/2023   NO PAST SURGERIES      Allergies: Patient has no known allergies.  Medications: Prior to Admission medications   Medication Sig Start Date End Date Taking? Authorizing Provider  docusate sodium (COLACE) 100 MG capsule Take 1 capsule (100 mg total) by mouth 2 (two) times daily. 07/18/17  Yes Myna Hidalgo, DO  IRON PO Take 1 tablet by mouth daily.   Yes [provider]  norethindrone (AYGESTIN) 5 MG tablet Take 5 mg by mouth daily.   Yes [provider]  oxyCODONE (OXY IR/ROXICODONE) 5 MG immediate release tablet Take 1 tablet (5 mg total) by mouth every 4 (four) hours as needed for moderate  pain. Patient not taking: Reported on 10/20/2023 07/18/17   Myna Hidalgo, DO  Prenatal Vit-Fe Fumarate-FA (PRENATAL MULTIVITAMIN) TABS Take 1 tablet by mouth daily.     [provider]     Family History  Problem Relation Age of Onset   Hyperlipidemia Mother    Hypertension Mother    Varicose Veins Mother     Social History   Socioeconomic History   Marital status: Married    Spouse name: Not on file   Number of children: Not on file   Years of education: Not on file   Highest education level: Not on file  Occupational History   Not on file  Tobacco Use   Smoking status: Never   Smokeless tobacco: Never  Substance and Sexual Activity   Alcohol use: No   Drug use: No   Sexual activity: Yes    Birth control/protection: None  Other Topics Concern   Not on file  Social History Narrative   Not on file   Social Determinants of Health   Financial Resource Strain: Not on file  Food Insecurity: Not on file  Transportation Needs: Not on file  Physical Activity: Not on file  Stress: Not on file  Social Connections: Unknown (04/22/2022)   Received from [facility]   Social Network    Social Network: Not on file    Review of Systems: A 12 point ROS discussed and pertinent positives are indicated in the HPI above.  All other systems are  negative.  Review of Systems  Vital Signs: BP 138/87 (BP Location: Left Arm, Patient Position: Sitting, Cuff Size: Normal)   Pulse 78   Temp 98.5 F (36.9 C) (Oral)   Resp 14   SpO2 99%     Physical Exam Constitutional:      General: She is not in acute distress.    Appearance: Normal appearance.  HENT:     Head: Normocephalic and atraumatic.  Eyes:     General: No scleral icterus. Cardiovascular:     Rate and Rhythm: Normal rate.  Pulmonary:     Effort: Pulmonary effort is normal.  Abdominal:     Palpations: Abdomen is soft.     Tenderness: There is no abdominal tenderness.  Skin:    General: Skin is warm and  dry.  Neurological:     Mental Status: She is alert and oriented to person, place, and time.  Psychiatric:        Mood and Affect: Mood normal.        Behavior: Behavior normal.       Imaging: IR Radiologist Eval & Mgmt  Result Date: 10/20/2023 EXAM: NEW PATIENT OFFICE VISIT CHIEF COMPLAINT: SEE EPIC NOTE HISTORY OF PRESENT ILLNESS: SEE EPIC NOTE REVIEW OF SYSTEMS: SEE EPIC NOTE PHYSICAL EXAMINATION: SEE EPIC NOTE ASSESSMENT AND PLAN: SEE EPIC NOTE Electronically Signed   By: Malachy Moan M.D.   On: 10/20/2023 16:40   MR PELVIS W WO CONTRAST  Result Date: 10/16/2023 CLINICAL DATA:  Menorrhagia.  Fibroids. EXAM: MRI PELVIS WITHOUT AND WITH CONTRAST TECHNIQUE: Multiplanar multisequence MR imaging of the pelvis was performed both before and after administration of intravenous contrast. CONTRAST:  8 mL Vueway COMPARISON:  None Available. FINDINGS: Lower Urinary Tract: No bladder or urethral abnormality identified. Bowel:  Unremarkable visualized pelvic bowel loops. Vascular/Lymphatic: No pathologically enlarged lymph nodes or other significant abnormality. Reproductive: -- Uterus: Measures 12.5 x 8.9 by 11.0 cm (volume = 640 cm^3). Diffuse uterine involvement by innumerable fibroids is seen. The largest fibroid is subserosal in location in the posterior lower uterine segment, measuring 7.6 cm in maximum diameter. A 2nd largest fibroid is intramural in location in the right lateral lower uterine corpus measuring 5.0 cm in maximum diameter. Other uterine fibroids measure 3 cm in diameter or smaller. -- Intracavitary fibroids: A 1.0 cm intracavitary fibroid is seen in the left fundal region. -- Pedunculated fibroids: Multiple subserosal fibroids are seen, however, no pedunculated fibroids identified. -- Fibroid contrast enhancement: All fibroids show diffuse contrast enhancement, except for the 5 cm subserosal fibroid in the right lateral lower uterine segment which shows central cystic degeneration.  -- Right ovary:  Appears normal.  No mass identified. -- Left ovary:  Appears normal.  No mass identified. Other: Small amount of free fluid noted in pelvic cul-de-sac. Musculoskeletal:  Unremarkable. IMPRESSION: Diffuse uterine involvement by innumerable fibroids, as described above. Two dominant fibroids are subserosal in location and measure 7.6 cm and 5.0 cm in maximum diameter. 1.0 cm intracavitary fibroid in left fundal region. No pedunculated fibroids identified. Normal appearance of both ovaries. . Electronically Signed   By: Danae Orleans M.D.   On: 10/16/2023 09:41    Labs:  CBC: Recent Labs    09/06/23 1159  WBC 4.1  HGB 8.8*  HCT 27.0*  PLT 219    COAGS: Recent Labs    09/06/23 1159  INR 1.0    BMP: Recent Labs    09/06/23 1159  NA 133*  K 3.5  CL 103  CO2 22  GLUCOSE 92  BUN 16  CALCIUM 8.3*  CREATININE 0.57  GFRNONAA >60    LIVER FUNCTION TESTS: Recent Labs    09/06/23 1159  BILITOT 0.2*  AST 15  ALT 12  ALKPHOS 38  PROT 6.8  ALBUMIN 3.5    TUMOR MARKERS: No results for input(s): "AFPTM", "CEA", "CA199", "CHROMGRNA" in the last 8760 hours.  Assessment and Plan  Very pleasant 37 year old female with highly symptomatic uterine fibroids.  Her primary symptom is menorrhagia resulting in symptomatic anemia.  Her fibroids are moderately symptomatic, her uterine fibroid symptom severity score was 66.  Additionally, her symptoms significantly impact her quality of life.  Her uterine fibroid symptom and health-related quality of life questionnaire score was 42 out of 100.  She has discussed hysterectomy but is not interested in pursuing hysterectomy at this time.  She is an excellent candidate for uterine artery embolization and is highly interested in pursuing embolization.  We discussed the risks, benefits and alternatives to uterine artery embolization.  I was able to answer all of her questions.  We also discussed the natural history of uterine  fibroids.  1.) please schedule for outpatient superior hypogastric nerve block and uterine artery embolization.  Thank you for this interesting consult.  I greatly enjoyed meeting Alice Osborne and look forward to participating in their care.  A copy of this report was sent to the requesting provider on this date.  Electronically Signed: Sterling Big 10/21/2023, 4:12 PM   I spent a total of 40 Minutes  in face to face in clinical consultation, greater than 50% of which was counseling/coordinating care for uterine fibroids.

## 2023-10-22 ENCOUNTER — Other Ambulatory Visit: Payer: Self-pay | Admitting: Interventional Radiology

## 2023-10-22 DIAGNOSIS — D250 Submucous leiomyoma of uterus: Secondary | ICD-10-CM

## 2023-11-05 ENCOUNTER — Other Ambulatory Visit: Payer: 59

## 2023-11-18 ENCOUNTER — Telehealth: Payer: Self-pay

## 2023-11-18 MED ORDER — NAPROXEN 500 MG PO TABS: 500.0000 mg | ORAL_TABLET | Freq: Two times a day (BID) | ORAL | 0 refills | Status: AC

## 2023-11-18 MED ORDER — ONDANSETRON HCL 8 MG PO TABS: 8.0000 mg | ORAL_TABLET | Freq: Three times a day (TID) | ORAL | 0 refills | Status: AC | PRN

## 2023-11-18 MED ORDER — KETOROLAC TROMETHAMINE 30 MG/ML IJ SOLN
30.0000 mg | Freq: Once | INTRAMUSCULAR | Status: AC
  Administered 2023-11-19: 30 mg via INTRAMUSCULAR

## 2023-11-18 MED ORDER — PROMETHAZINE HCL 12.5 MG PO TABS: 12.5000 mg | ORAL_TABLET | ORAL | 0 refills | Status: AC | PRN

## 2023-11-18 MED ORDER — DOCUSATE SODIUM 100 MG PO CAPS: 100.0000 mg | ORAL_CAPSULE | Freq: Two times a day (BID) | ORAL | 0 refills | Status: AC

## 2023-11-18 MED ORDER — KETOROLAC TROMETHAMINE 30 MG/ML IJ SOLN
30.0000 mg | Freq: Once | INTRAMUSCULAR | Status: AC
  Administered 2023-11-19: 30 mg via INTRAVENOUS

## 2023-11-18 NOTE — Telephone Encounter (Signed)
Pt. Is to take Naproxen 500 mg twice a day for 7 days. Colace 100 mg twice a day for 7 days. Percocet 7.5/325mg every 4 hours for 5 days for pain. Zofran 8mg every 8 hours for three days and then as needed for nausea/vomiting. Phenergan 12.5 mg every four hours as needed for nausea/vomiting.  

## 2023-11-19 ENCOUNTER — Ambulatory Visit
Admission: RE | Admit: 2023-11-19 | Discharge: 2023-11-19 | Disposition: A | Discharge status: Home / Self Care | Payer: 59 | Source: Ambulatory Visit | Attending: Interventional Radiology | Admitting: Interventional Radiology

## 2023-11-19 ENCOUNTER — Other Ambulatory Visit: Payer: Self-pay | Admitting: Interventional Radiology

## 2023-11-19 DIAGNOSIS — D250 Submucous leiomyoma of uterus: Secondary | ICD-10-CM

## 2023-11-19 DIAGNOSIS — D219 Benign neoplasm of connective and other soft tissue, unspecified: Secondary | ICD-10-CM

## 2023-11-19 HISTORY — PX: IR EMBO TUMOR ORGAN ISCHEMIA INFARCT INC GUIDE ROADMAPPING: IMG5449

## 2023-11-19 HISTORY — PX: IR FLUORO GUIDED NEEDLE PLC ASPIRATION/INJECTION LOC: IMG2395

## 2023-11-19 MED ORDER — SODIUM CHLORIDE 0.9 % IV SOLN: 10.0000 mg | Freq: Once | INTRAVENOUS | Status: DC

## 2023-11-19 MED ORDER — CEFAZOLIN SODIUM-DEXTROSE 2-4 GM/100ML-% IV SOLN
2.0000 g | INTRAVENOUS | Status: AC
  Administered 2023-11-19: 2 g via INTRAVENOUS

## 2023-11-19 MED ORDER — PROMETHAZINE HCL 25 MG PO TABS
25.0000 mg | ORAL_TABLET | Freq: Once | ORAL | Status: AC
  Administered 2023-11-19: 25 mg via ORAL

## 2023-11-19 MED ORDER — MIDAZOLAM HCL 2 MG/2ML IJ SOLN
INTRAMUSCULAR | Status: AC | PRN
  Administered 2023-11-19 (×2): 1 mg via INTRAVENOUS

## 2023-11-19 MED ORDER — FENTANYL CITRATE (PF) 100 MCG/2ML IJ SOLN
INTRAMUSCULAR | Status: AC | PRN
  Administered 2023-11-19 (×2): 50 ug via INTRAVENOUS

## 2023-11-19 MED ORDER — ACETAMINOPHEN 10 MG/ML IV SOLN
1000.0000 mg | Freq: Once | INTRAVENOUS | Status: AC
  Administered 2023-11-19: 1000 mg via INTRAVENOUS

## 2023-11-19 MED ORDER — PANTOPRAZOLE SODIUM 40 MG IV SOLR
40.0000 mg | Freq: Once | INTRAVENOUS | Status: AC
  Administered 2023-11-19: 40 mg via INTRAVENOUS

## 2023-11-19 MED ORDER — HYDROMORPHONE HCL 1 MG/ML IJ SOLN
1.0000 mg | Freq: Once | INTRAMUSCULAR | Status: AC
Start: 2023-11-19 — End: 2023-11-19
  Administered 2023-11-19: 1 mg via INTRAVENOUS

## 2023-11-19 MED ORDER — MIDAZOLAM HCL 5 MG/5ML IJ SOLN
INTRAMUSCULAR | Status: AC | PRN
  Administered 2023-11-19 (×2): 1 mg via INTRAVENOUS

## 2023-11-19 MED ORDER — ONDANSETRON HCL 4 MG/2ML IJ SOLN
8.0000 mg | Freq: Once | INTRAMUSCULAR | Status: AC
  Administered 2023-11-19: 8 mg via INTRAVENOUS

## 2023-11-19 MED ORDER — IOPAMIDOL (ISOVUE-300) INJECTION 61%
120.0000 mL | Freq: Once | INTRAVENOUS | Status: AC | PRN
Start: 2023-11-19 — End: 2023-11-19
  Administered 2023-11-19: 120 mL via INTRA_ARTERIAL

## 2023-11-19 MED ORDER — FENTANYL CITRATE PF 50 MCG/ML IJ SOSY: 25.0000 ug | PREFILLED_SYRINGE | INTRAMUSCULAR | Status: DC | PRN

## 2023-11-19 MED ORDER — SODIUM CHLORIDE 0.9 % IV SOLN: INTRAVENOUS | Status: DC

## 2023-11-19 MED ORDER — CEFAZOLIN SODIUM-DEXTROSE 2-4 GM/100ML-% IV SOLN: INTRAVENOUS | Status: AC | PRN

## 2023-11-19 MED ORDER — MIDAZOLAM HCL 2 MG/2ML IJ SOLN: 1.0000 mg | INTRAMUSCULAR | Status: DC | PRN

## 2023-11-19 NOTE — Discharge Instructions (Signed)
Uterine Artery Embolization After Care   What can I expect after the procedure?   After the procedure, it is common to have:   Mild pain or discomfort at the arterial entry site   Uterine Cramping   Cramps can vary in strength from what you would consider to be a bad menstrual cycle all the way up to as severe as labor pains.   The cramping is typically the most severe the afternoon and evening the day of the procedure and begin to improve the next day and each day thereafter.   Vaginal bleeding. Your cycle may become irregular the first several months.   Vaginal discharge. We recommend you wear a panty liner for first 4-6 weeks following your procedure.    Nausea and vomiting.      Follow these instructions at home:   Medicines   Take your medicine exactly as told, at the same time every day. This is vital to helping you with a smooth recovery.   Zofran (ondansetron) is used to prevent nausea before it starts.  You will have a prescription to take 8 mg of this medication every 8 hours.  You should take this even if you don't feel nauseated because it is meant to prevent the nausea from occurring.  Once you get nauseated and start to vomit, you may not be able to keep your other medicines down and your pain can be left untreated.  You can take this with every other dose of the oxycodone/acetaminophen   Naproxen is a non-steroidal anti-inflammatory medicine called and is critical in keeping your inflammation and pain under control.  You must take this every 12 hrs. We recommend 8 am and 8 pm.    Percocet (oxycodone/acetaminophen) is a combination narcotic pain medication with Tylenol.  This is to help with your pain.  Take it every 4 hours regardless of your pain level the first 2 days.  Set an alarm to wake up so you don't miss a dose overnight and get behind on your pain control.  After the first 48 hrs, if your pain is minimal you can take only as needed.    Colace (docusate  sodium) is a stool softener to help prevent constipation.  The pain medications often cause constipation which can be particularly uncomfortable after [place].  We recommend you take this at 8 am and 8 pm with your naproxen.    Phenergan (promethazine) is another medication for nausea.  If you still feel sick to your stomach or vomit despite the Zofran (ondansetron) take this medicine every 8 hours as needed.    Incision care   Leave your bandage on for 24 hrs and keep the area dry   You may remove the bandage after 24 hrs and then shower as normal.    Do not submerge in a bath, pool or hot tub until the small incision is completely healed (5-7 days).   Activity   Do not lift anything that is heavier than 5 lb (2.3 kg)for the first 3 days.     Return to your normal activities after day 5.  Take it slowly and listen to your body. Ask your health care provider what activities are safe for you.   General instructions   Many women find a hot water bottle or heating pad helpful when placed on the lower abdomen.  This is fine to do if it helps your cramps.    Do not use any products that contain nicotine or tobacco.   These products include cigarettes, chewing tobacco, and vaping devices, such as e-cigarettes. These can delay incision healing. If you need help quitting, ask your health care provider.   Do not have sex or put anything in your vagina for at least 14 days.    Do not drink alcohol until your health care provider says it is okay.   Keep all follow-up visits. This is important.      Please contact our office at [phone] for the following symptoms:   You have a fever.   You have more redness, swelling, or pain around your incision.   You have more fluid or blood coming from your incision site.   Your incision feels warm to the touch.   You have pus or a bad smell coming from your incision or vagina.   You have a rash.   You have nausea, or you cannot eat or drink  anything without vomiting.   You have a vaginal discharge that is not getting lighter.   Get help right away if:   You have trouble breathing.   You have chest pain.   You have severe pain in your abdomen, and it does not get better with medicine.   You have leg pain or leg swelling.   You feel dizzy, or you faint.   Do not wait to see if the symptoms will go away.   Do not drive yourself to the hospital.      These symptoms may be an emergency.    Get help right away. Call 911.   Summary   After the procedure, it is common to have cramps, or pain or discomfort at the incision site. You will be given pain medicine.   Follow instructions from your health care provider about how to take care of your incision. Check your incision area every day for signs of infection.   Take over-the-counter and prescription medicines only as told by your health care provider.   Contact your health care provider if you have symptoms of infection or other symptoms that do not get better with treatment.   Thanks for visiting [facility] ! 

## 2023-11-26 ENCOUNTER — Other Ambulatory Visit: Payer: Self-pay | Admitting: Interventional Radiology

## 2023-11-26 ENCOUNTER — Telehealth: Payer: Self-pay

## 2023-11-26 DIAGNOSIS — D259 Leiomyoma of uterus, unspecified: Secondary | ICD-10-CM

## 2023-11-27 NOTE — Telephone Encounter (Signed)
Phone call to pt to follow up from her [place] on 11/19/23. Pt. Reports that her pain over the week was manageable.. Pt. Reports being able to keep liquids and solid foods down. Pt. Denies any signs of bleeding, infection, redness at the right femoral site, drainage at the site, fever, nausea, or vomiting. Pt has no complaints at this time. Pt. Will be scheduled for a follow up call with Dr. Archer Asa within the week. Pt advised to call back if anything were to change or any concerns arise and we will arrange an in person appointment. Pt verbalized understanding.

## 2023-12-01 ENCOUNTER — Inpatient Hospital Stay
Admission: RE | Admit: 2023-12-01 | Discharge: 2023-12-01 | Disposition: A | Discharge status: Home / Self Care | Payer: 59 | Source: Ambulatory Visit | Attending: Interventional Radiology | Admitting: Interventional Radiology

## 2023-12-01 DIAGNOSIS — D259 Leiomyoma of uterus, unspecified: Secondary | ICD-10-CM

## 2023-12-01 HISTORY — PX: IR RADIOLOGIST EVAL & MGMT: IMG5224

## 2023-12-01 NOTE — Progress Notes (Signed)
Chief Complaint: Patient was consulted remotely today (TeleHealth) for uterine fibroids post [place] at the request of Joab Carden K.    Referring Physician(s): Chezney Huether K (initial consult from Steva Ready )  History of Present Illness: Alice Osborne is a 37 y.o. female wWith a history of highly symptomatic uterine fibroids.  She underwent superior hypogastric nerve block and bilateral uterine artery embolization on 11/19/2023.  We spoke over the phone today for her 2-week follow-up evaluation.  She reports that she is doing very well.  She has returned to work.  In fact, she is on her lunch break and outside walking while we speak.  She has no pain, fever, chills or foul-smelling discharge.  She is having some intermittent mucousy discharge which is not unexpected.  Overall, she is happy with her outcome thus far.  She continues to take the low-dose progesterone pill for contraception as prescribed by her gynecologist.  She reports that she will continue to follow-up with him regarding this prescription and when she may be able to come off of it.ith a history of highly symptomatic uterine fibroids.  She underwent superior hypogastric nerve block and bilateral uterine artery embolization on 11/19/2023.  We spoke over the phone today for her 2-week follow-up evaluation.  She reports that she is doing very well.  She has returned to work.  In fact, she is on her lunch break and outside walking while we speak.  She has no pain, fever, chills or foul-smelling discharge.  She is having some intermittent mucousy discharge which is not unexpected.  Overall, she is happy with her outcome thus far.  She continues to take the low-dose progesterone pill for contraception as prescribed by her gynecologist.  She reports that she will continue to follow-up with him regarding this prescription and when she may be able to come off of it.  Past Medical History:  Diagnosis Date   Anemia     Fibroid    GERD (gastroesophageal reflux disease)    Headache    Pregnancy induced hypertension     Past Surgical History:  Procedure Laterality Date   IR EMBO TUMOR ORGAN ISCHEMIA INFARCT INC GUIDE ROADMAPPING  11/19/2023   IR FLUORO GUIDED NEEDLE PLC ASPIRATION/INJECTION LOC  11/19/2023   IR RADIOLOGIST EVAL & MGMT  10/20/2023   IR RADIOLOGIST EVAL & MGMT  12/01/2023   NO PAST SURGERIES      Allergies: Patient has no known allergies.  Medications: Prior to Admission medications   Medication Sig Start Date End Date Taking? Authorizing Provider  docusate sodium (COLACE) 100 MG capsule Take 1 capsule (100 mg total) by mouth 2 (two) times daily. 07/18/17   Myna Hidalgo, DO  IRON PO Take 1 tablet by mouth daily.    [provider]  norethindrone (AYGESTIN) 5 MG tablet Take 5 mg by mouth daily.    [provider]  ondansetron (ZOFRAN) 8 MG tablet Take 1 tablet (8 mg total) by mouth every 8 (eight) hours as needed for nausea or vomiting. 11/18/23   Sterling Big, MD  oxyCODONE (OXY IR/ROXICODONE) 5 MG immediate release tablet Take 1 tablet (5 mg total) by mouth every 4 (four) hours as needed for moderate pain. Patient not taking: Reported on 10/20/2023 07/18/17   Myna Hidalgo, DO  Prenatal Vit-Fe Fumarate-FA (PRENATAL MULTIVITAMIN) TABS Take 1 tablet by mouth daily.     [provider]  promethazine (PHENERGAN) 12.5 MG tablet Take 1 tablet (12.5 mg total) by mouth every  4 (four) hours as needed for nausea or vomiting. 11/18/23   Sterling Big, MD     Family History  Problem Relation Age of Onset   Hyperlipidemia Mother    Hypertension Mother    Varicose Veins Mother     Social History   Socioeconomic History   Marital status: Married    Spouse name: Not on file   Number of children: Not on file   Years of education: Not on file   Highest education level: Not on file  Occupational History   Not on file  Tobacco Use   Smoking status: Never    Smokeless tobacco: Never  Substance and Sexual Activity   Alcohol use: No   Drug use: No   Sexual activity: Yes    Birth control/protection: None  Other Topics Concern   Not on file  Social History Narrative   Not on file   Social Drivers of Health   Financial Resource Strain: Not on file  Food Insecurity: Not on file  Transportation Needs: Not on file  Physical Activity: Not on file  Stress: Not on file  Social Connections: Unknown (04/22/2022)   Received from [facility]   Social Network    Social Network: Not on file    Review of Systems  Review of Systems: A 12 point ROS discussed and pertinent positives are indicated in the HPI above.  All other systems are negative.    Physical Exam No direct physical exam was performed (except for noted visual exam findings with Video Visits).   Vital Signs: There were no vitals taken for this visit.  Imaging: IR Radiologist Eval & Mgmt Result Date: 12/01/2023 EXAM: ESTABLISHED PATIENT OFFICE VISIT CHIEF COMPLAINT: SEE NOTE IN EPIC HISTORY OF PRESENT ILLNESS: SEE NOTE IN EPIC REVIEW OF SYSTEMS: SEE NOTE IN EPIC PHYSICAL EXAMINATION: SEE NOTE IN EPIC ASSESSMENT AND PLAN: SEE NOTE IN EPIC Electronically Signed   By: Malachy Moan M.D.   On: 12/01/2023 14:09   IR EMBO TUMOR ORGAN ISCHEMIA INFARCT INC GUIDE ROADMAPPING Result Date: 11/19/2023 INDICATION: Symptomatic uterine fibroids. EXAM: IR FLUORO GUIDE NEEDLE PLACEMENT /BIOPSY; IR EMBO TUMOR ORGAN ISCHEMIA INFARCT INC GUIDE ROADMAPPING 1.)  Superior hypogastric nerve block with fluoro guidance 2.)  Bilateral [place] MEDICATIONS: PRE PROCEDURE: 1000 mg Tylenol IV, 10 mg Decadron IV, 2 g Ancef IV, Protonix 40 mg IV, Zofran 8 mg IV administered by radiology nursing under my supervision. INTRA PROCEDURE: Toradol 30 mg IV, Toradol 30 mg IM administered by radiology nursing under my supervision. POST PROCEDURE: Dilaudid 1 mg IV and Phenergan 25 mg p.o. administered by radiology nursing under  my supervision. ANESTHESIA/SEDATION: Moderate (conscious) sedation was employed during this procedure. A total of Versed 4 mg and Fentanyl 100 mcg was administered intravenously by radiology nursing under my supervision. Moderate Sedation Time: 40 minutes. The patient's level of consciousness and vital signs were monitored continuously by radiology nursing throughout the procedure under my direct supervision. CONTRAST:  80 mL Isovue-300 FLUOROSCOPY: Radiation Exposure Index (as provided by the fluoroscopic device): 564.5 mGy Kerma COMPLICATIONS: None immediate. PROCEDURE: Informed consent was obtained from the patient following explanation of the procedure, risks, benefits and alternatives. The patient understands, agrees and consents for the procedure. All questions were addressed. A time out was performed prior to the initiation of the procedure. Maximal barrier sterile technique utilized including caps, mask, sterile gowns, sterile gloves, large sterile drape, hand hygiene, and Betadine prep. The right common femoral artery was interrogated with ultrasound  and found to be widely patent. An image was obtained and stored for the medical record. Local anesthesia was attained by infiltration with 1% lidocaine. A small dermatotomy was made. Under real-time sonographic guidance, the vessel was punctured with a 21 gauge micropuncture needle. Using standard technique, the initial micro needle was exchanged over a 0.018 micro wire for a transitional 4 [place] micro sheath. The micro sheath was then exchanged over a 0.035 wire for a 5 French vascular sheath. A C2 cobra catheter was advanced over a Bentson wire up in over the aortic bifurcation and into the left internal iliac artery. Attention was then turned to the superior hypogastric block portion of the procedure. The image intensifier was angulated until the L5 vertebral body could be viewed en face. A suitable skin entry site targeting the central aspect of the L5  vertebral body was identified and marked. Local anesthesia was attained by infiltration with 1% lidocaine. Under intermittent fluoroscopic guidance, a 20 cm 22 gauge Chiba needle was carefully advanced onto the anterior surface of the L5 vertebral body. This was confirmed in the lateral projection. A gentle injection of contrast material was performed in both the lateral and frontal projections confirming that the needle tip is within the prevertebral soft tissues and there is no vascular uptake. 20 mL of 0.5% ropivacaine was then slowly injected. The needle was then removed. Attention was turned back to portion procedure. A left internal iliac arteriogram was performed. The origin of the left uterine artery was identified. A renegade hiFlo microcatheter was advanced over a Fathom 16 wire into the horizontal segment of the left uterine artery. Arteriography was performed confirming opacification of the intramural uterine branches. No evidence of cervical vaginal branch. Particle embolization was performed utilizing 1 vial of 500 micron Embozene and 1 vial of 700 micron Embozene. Postembolization arteriography confirms near stasis. The microcatheter was removed. A Sos Omni flush catheter was used to select the ipsilateral right internal iliac artery. Contrast injection was performed with digital subtraction angiography and the origin of the right uterine artery was identified. The high-flow microcatheter was advanced over the Fathom 16 wire into The horizontal segment of the right uterine artery. Arteriography was performed confirming catheter placement and absence of cervicalvaginal branches distal to the catheter tip. Particle embolization was performed utilizing 0.5 vial of 500 micron Embozene. Follow-up contrast injection confirms near stasis. The Waltman's loop was un formed and the catheter removed over a Bentson wire. Hemostasis was then attained with the assistance of a Celt arterial closure device (ACD).  IMPRESSION: 1. Technically successful superior hypogastric nerve block. 2. Technically successful bilateral uterine artery embolization. Electronically Signed   By: Malachy Moan M.D.   On: 11/19/2023 14:54   IR Fluoro Guide Ndl Plmt / BX Result Date: 11/19/2023 INDICATION: Symptomatic uterine fibroids. EXAM: IR FLUORO GUIDE NEEDLE PLACEMENT /BIOPSY; IR EMBO TUMOR ORGAN ISCHEMIA INFARCT INC GUIDE ROADMAPPING 1.)  Superior hypogastric nerve block with fluoro guidance 2.)  Bilateral [place] MEDICATIONS: PRE PROCEDURE: 1000 mg Tylenol IV, 10 mg Decadron IV, 2 g Ancef IV, Protonix 40 mg IV, Zofran 8 mg IV administered by radiology nursing under my supervision. INTRA PROCEDURE: Toradol 30 mg IV, Toradol 30 mg IM administered by radiology nursing under my supervision. POST PROCEDURE: Dilaudid 1 mg IV and Phenergan 25 mg p.o. administered by radiology nursing under my supervision. ANESTHESIA/SEDATION: Moderate (conscious) sedation was employed during this procedure. A total of Versed 4 mg and Fentanyl 100 mcg was administered intravenously by radiology nursing  under my supervision. Moderate Sedation Time: 40 minutes. The patient's level of consciousness and vital signs were monitored continuously by radiology nursing throughout the procedure under my direct supervision. CONTRAST:  80 mL Isovue-300 FLUOROSCOPY: Radiation Exposure Index (as provided by the fluoroscopic device): 564.5 mGy Kerma COMPLICATIONS: None immediate. PROCEDURE: Informed consent was obtained from the patient following explanation of the procedure, risks, benefits and alternatives. The patient understands, agrees and consents for the procedure. All questions were addressed. A time out was performed prior to the initiation of the procedure. Maximal barrier sterile technique utilized including caps, mask, sterile gowns, sterile gloves, large sterile drape, hand hygiene, and Betadine prep. The right common femoral artery was interrogated with ultrasound  and found to be widely patent. An image was obtained and stored for the medical record. Local anesthesia was attained by infiltration with 1% lidocaine. A small dermatotomy was made. Under real-time sonographic guidance, the vessel was punctured with a 21 gauge micropuncture needle. Using standard technique, the initial micro needle was exchanged over a 0.018 micro wire for a transitional 4 [place] micro sheath. The micro sheath was then exchanged over a 0.035 wire for a 5 French vascular sheath. A C2 cobra catheter was advanced over a Bentson wire up in over the aortic bifurcation and into the left internal iliac artery. Attention was then turned to the superior hypogastric block portion of the procedure. The image intensifier was angulated until the L5 vertebral body could be viewed en face. A suitable skin entry site targeting the central aspect of the L5 vertebral body was identified and marked. Local anesthesia was attained by infiltration with 1% lidocaine. Under intermittent fluoroscopic guidance, a 20 cm 22 gauge Chiba needle was carefully advanced onto the anterior surface of the L5 vertebral body. This was confirmed in the lateral projection. A gentle injection of contrast material was performed in both the lateral and frontal projections confirming that the needle tip is within the prevertebral soft tissues and there is no vascular uptake. 20 mL of 0.5% ropivacaine was then slowly injected. The needle was then removed. Attention was turned back to portion procedure. A left internal iliac arteriogram was performed. The origin of the left uterine artery was identified. A renegade hiFlo microcatheter was advanced over a Fathom 16 wire into the horizontal segment of the left uterine artery. Arteriography was performed confirming opacification of the intramural uterine branches. No evidence of cervical vaginal branch. Particle embolization was performed utilizing 1 vial of 500 micron Embozene and 1 vial of 700  micron Embozene. Postembolization arteriography confirms near stasis. The microcatheter was removed. A Sos Omni flush catheter was used to select the ipsilateral right internal iliac artery. Contrast injection was performed with digital subtraction angiography and the origin of the right uterine artery was identified. The high-flow microcatheter was advanced over the Fathom 16 wire into The horizontal segment of the right uterine artery. Arteriography was performed confirming catheter placement and absence of cervicalvaginal branches distal to the catheter tip. Particle embolization was performed utilizing 0.5 vial of 500 micron Embozene. Follow-up contrast injection confirms near stasis. The Waltman's loop was un formed and the catheter removed over a Bentson wire. Hemostasis was then attained with the assistance of a Celt arterial closure device (ACD). IMPRESSION: 1. Technically successful superior hypogastric nerve block. 2. Technically successful bilateral uterine artery embolization. Electronically Signed   By: Malachy Moan M.D.   On: 11/19/2023 14:54    Labs:  CBC: Recent Labs    09/06/23 1159  WBC 4.1  HGB 8.8*  HCT 27.0*  PLT 219    COAGS: Recent Labs    09/06/23 1159  INR 1.0    BMP: Recent Labs    09/06/23 1159  NA 133*  K 3.5  CL 103  CO2 22  GLUCOSE 92  BUN 16  CALCIUM 8.3*  CREATININE 0.57  GFRNONAA >60    LIVER FUNCTION TESTS: Recent Labs    09/06/23 1159  BILITOT 0.2*  AST 15  ALT 12  ALKPHOS 38  PROT 6.8  ALBUMIN 3.5    TUMOR MARKERS: No results for input(s): "AFPTM", "CEA", "CA199", "CHROMGRNA" in the last 8760 hours.  Assessment and Plan:  Extremely pleasant 37 year old female 2 weeks status post uterine artery embolization for the treatment of symptomatic uterine fibroids.  She is doing well and completely recovered.  Her only persistent symptom is a symptomatic discharge which is expected.  1.)  Next follow-up visit at 3 months post  embolization (early March 2025).     Electronically Signed: Sterling Big 12/01/2023, 2:59 PM   I spent a total of 15 Minutes in remote  clinical consultation, greater than 50% of which was counseling/coordinating care for uterine fibroids 2 weeks post [place].    Visit type: Audio only (telephone). Audio (no video) only due to patient preference. Alternative for in-person consultation at [facility], [address] [place], [place], [place]. This visit type was conducted due to national recommendations for restrictions regarding the COVID-19 Pandemic (e.g. social distancing).  This format is felt to be most appropriate for this patient at this time.  All issues noted in this document were discussed and addressed.

## 2024-04-19 DIAGNOSIS — D509 Iron deficiency anemia, unspecified: Secondary | ICD-10-CM | POA: Diagnosis not present

## 2024-04-19 DIAGNOSIS — D219 Benign neoplasm of connective and other soft tissue, unspecified: Secondary | ICD-10-CM | POA: Diagnosis not present

## 2024-04-19 DIAGNOSIS — Z01419 Encounter for gynecological examination (general) (routine) without abnormal findings: Secondary | ICD-10-CM | POA: Diagnosis not present

## 2024-06-30 ENCOUNTER — Other Ambulatory Visit: Payer: Self-pay | Admitting: Interventional Radiology

## 2024-06-30 DIAGNOSIS — D219 Benign neoplasm of connective and other soft tissue, unspecified: Secondary | ICD-10-CM

## 2024-07-29 DIAGNOSIS — Z13220 Encounter for screening for lipoid disorders: Secondary | ICD-10-CM | POA: Diagnosis not present

## 2024-07-29 DIAGNOSIS — Z0000 Encounter for general adult medical examination without abnormal findings: Secondary | ICD-10-CM | POA: Diagnosis not present

## 2024-09-14 DIAGNOSIS — Z9889 Other specified postprocedural states: Secondary | ICD-10-CM | POA: Diagnosis not present

## 2024-09-14 DIAGNOSIS — D251 Intramural leiomyoma of uterus: Secondary | ICD-10-CM | POA: Diagnosis not present

## 2024-09-14 DIAGNOSIS — Z01411 Encounter for gynecological examination (general) (routine) with abnormal findings: Secondary | ICD-10-CM | POA: Diagnosis not present

## 2024-09-14 DIAGNOSIS — N939 Abnormal uterine and vaginal bleeding, unspecified: Secondary | ICD-10-CM | POA: Diagnosis not present

## 2024-09-14 DIAGNOSIS — Z1331 Encounter for screening for depression: Secondary | ICD-10-CM | POA: Diagnosis not present

## 2024-09-14 DIAGNOSIS — Z124 Encounter for screening for malignant neoplasm of cervix: Secondary | ICD-10-CM | POA: Diagnosis not present
